# Patient Record
Sex: Female | Born: 1980 | Race: Black or African American | Hispanic: No | Marital: Single | Smoking: Never smoker
Health system: Southern US, Community
[De-identification: ages and names within clinical notes are randomized; demographics above are authoritative.]

## PROBLEM LIST (undated history)

## (undated) DIAGNOSIS — R071 Chest pain on breathing: Secondary | ICD-10-CM

## (undated) DIAGNOSIS — O2390 Unspecified genitourinary tract infection in pregnancy, unspecified trimester: Secondary | ICD-10-CM

## (undated) DIAGNOSIS — Z789 Other specified health status: Secondary | ICD-10-CM

## (undated) DIAGNOSIS — Z87898 Personal history of other specified conditions: Secondary | ICD-10-CM

## (undated) DIAGNOSIS — R55 Syncope and collapse: Secondary | ICD-10-CM

## (undated) DIAGNOSIS — Z332 Encounter for elective termination of pregnancy: Secondary | ICD-10-CM

## (undated) DIAGNOSIS — G5600 Carpal tunnel syndrome, unspecified upper limb: Secondary | ICD-10-CM

## (undated) HISTORY — DX: Syncope and collapse: R55

## (undated) HISTORY — PX: INDUCED ABORTION: SHX677

## (undated) HISTORY — DX: Personal history of other specified conditions: Z87.898

## (undated) HISTORY — DX: Unspecified genitourinary tract infection in pregnancy, unspecified trimester: O23.90

## (undated) HISTORY — DX: Chest pain on breathing: R07.1

## (undated) HISTORY — DX: Carpal tunnel syndrome, unspecified upper limb: G56.00

---

## 2006-06-16 ENCOUNTER — Emergency Department (HOSPITAL_COMMUNITY): Admission: EM | Admit: 2006-06-16 | Discharge: 2006-06-16 | Payer: Self-pay | Admitting: Emergency Medicine

## 2007-11-17 ENCOUNTER — Emergency Department (HOSPITAL_COMMUNITY): Admission: EM | Admit: 2007-11-17 | Discharge: 2007-11-17 | Payer: Self-pay | Admitting: Emergency Medicine

## 2009-02-08 ENCOUNTER — Emergency Department (HOSPITAL_COMMUNITY): Admission: EM | Admit: 2009-02-08 | Discharge: 2009-02-08 | Payer: Self-pay | Admitting: Emergency Medicine

## 2009-05-26 ENCOUNTER — Emergency Department (HOSPITAL_COMMUNITY): Admission: EM | Admit: 2009-05-26 | Discharge: 2009-05-26 | Payer: Self-pay | Admitting: Emergency Medicine

## 2010-10-06 LAB — URINE MICROSCOPIC-ADD ON

## 2010-10-06 LAB — URINALYSIS, ROUTINE W REFLEX MICROSCOPIC
Ketones, ur: NEGATIVE mg/dL
Protein, ur: NEGATIVE mg/dL
Urobilinogen, UA: 1 mg/dL (ref 0.0–1.0)

## 2010-10-06 LAB — BASIC METABOLIC PANEL
BUN: 8 mg/dL (ref 6–23)
Chloride: 109 mEq/L (ref 96–112)
GFR calc non Af Amer: >60 mL/min (ref >60)
Glucose, Bld: 109 mg/dL — ABNORMAL HIGH (ref 70–99)
Potassium: 3.3 mEq/L — ABNORMAL LOW (ref 3.5–5.1)
Sodium: 141 mEq/L (ref 135–145)

## 2010-10-06 LAB — POCT PREGNANCY, URINE: Preg Test, Ur: NEGATIVE

## 2011-03-27 LAB — POCT I-STAT, CHEM 8
Chloride: 105
Glucose, Bld: 94
HCT: 43
Hemoglobin: 14.6
Potassium: 3.8
Sodium: 142

## 2011-05-11 ENCOUNTER — Emergency Department (HOSPITAL_BASED_OUTPATIENT_CLINIC_OR_DEPARTMENT_OTHER)
Admission: EM | Admit: 2011-05-11 | Discharge: 2011-05-11 | Disposition: A | Discharge status: Home / Self Care | Payer: 59 | Attending: Emergency Medicine | Admitting: Emergency Medicine

## 2011-05-11 ENCOUNTER — Encounter: Payer: Self-pay | Admitting: *Deleted

## 2011-05-11 DIAGNOSIS — N760 Acute vaginitis: Secondary | ICD-10-CM | POA: Insufficient documentation

## 2011-05-11 DIAGNOSIS — R109 Unspecified abdominal pain: Secondary | ICD-10-CM | POA: Insufficient documentation

## 2011-05-11 DIAGNOSIS — F17200 Nicotine dependence, unspecified, uncomplicated: Secondary | ICD-10-CM | POA: Insufficient documentation

## 2011-05-11 DIAGNOSIS — B9689 Other specified bacterial agents as the cause of diseases classified elsewhere: Secondary | ICD-10-CM | POA: Insufficient documentation

## 2011-05-11 DIAGNOSIS — N72 Inflammatory disease of cervix uteri: Secondary | ICD-10-CM | POA: Insufficient documentation

## 2011-05-11 DIAGNOSIS — A499 Bacterial infection, unspecified: Secondary | ICD-10-CM | POA: Insufficient documentation

## 2011-05-11 LAB — URINALYSIS, ROUTINE W REFLEX MICROSCOPIC
Bilirubin Urine: NEGATIVE
Leukocytes, UA: NEGATIVE
Nitrite: NEGATIVE
Specific Gravity, Urine: 1.026 (ref 1.005–1.030)
Urobilinogen, UA: 1 mg/dL (ref 0.0–1.0)
pH: 6 (ref 5.0–8.0)

## 2011-05-11 LAB — PREGNANCY, URINE: Preg Test, Ur: NEGATIVE

## 2011-05-11 LAB — WET PREP, GENITAL
Trich, Wet Prep: NONE SEEN
Yeast Wet Prep HPF POC: NONE SEEN

## 2011-05-11 MED ORDER — IBUPROFEN 800 MG PO TABS
800.0000 mg | ORAL_TABLET | Freq: Once | ORAL | Status: AC
  Administered 2011-05-11: 800 mg via ORAL
  Filled 2011-05-11: qty 1

## 2011-05-11 MED ORDER — METRONIDAZOLE 500 MG PO TABS: 500.0000 mg | ORAL_TABLET | Freq: Two times a day (BID) | ORAL | Status: AC

## 2011-05-11 MED ORDER — LIDOCAINE HCL (PF) 1 % IJ SOLN
INTRAMUSCULAR | Status: AC
  Filled 2011-05-11: qty 5

## 2011-05-11 MED ORDER — CEFTRIAXONE SODIUM 250 MG IJ SOLR
250.0000 mg | Freq: Once | INTRAMUSCULAR | Status: AC
  Administered 2011-05-11: 250 mg via INTRAMUSCULAR
  Filled 2011-05-11: qty 250

## 2011-05-11 MED ORDER — AZITHROMYCIN 1 G PO PACK
1.0000 g | PACK | Freq: Once | ORAL | Status: AC
  Administered 2011-05-11: 1 g via ORAL
  Filled 2011-05-11: qty 1

## 2011-05-11 NOTE — ED Notes (Signed)
Care plan and follow up reviewed Pt knowlegable pleasent

## 2011-05-11 NOTE — ED Notes (Signed)
Pt states she had her mirena implanted in 07 and this is the year it is supposed to come out. Began having abd pain about 3 weeks ago with different amts of vag bleeding. Also c/o anxiety with shob and headache. Pain is worse when sitting, better standing. Thick white discharge.

## 2011-05-11 NOTE — ED Provider Notes (Signed)
History     CSN: [id number] Arrival date & time: 05/11/2011  1:17 PM   First MD Initiated Contact with Patient 05/11/11 1341      Chief Complaint  Patient presents with  . Abdominal Pain    (Consider location/radiation/quality/duration/timing/severity/associated sxs/prior treatment) HPI Patient presents with lower abdominal pain which has been present for 3 weeks. She states that the pain is similar to cramping in his located in the middle of her lower abdomen. She's had no fevers or vomiting or diarrhea. She had some vaginal bleeding 2 days ago and today spotting only. She has not passed any blood clots. She states she had similar pain when her IUD was implanted in 2007. She states she has contacted her gynecologist and has a followup appointment scheduled but came in today due to the pain. She also complains of white vaginal discharge. She has been taking ibuprofen which helps somewhat with the pain she has not had any today. She denies any other associated symptoms. Pain is described as moderate in severity.  History reviewed. No pertinent past medical history.  History reviewed. No pertinent past surgical history.  History reviewed. No pertinent family history.  History  Substance Use Topics  . Smoking status: Current Some Day Smoker  . Smokeless tobacco: Not on file  . Alcohol Use: Yes    OB History    Grav Para Term Preterm Abortions TAB SAB Ect Mult Living                  Review of Systems ROS reviewed and otherwise negative except for mentioned in HPI Allergies  Review of patient's allergies indicates no known allergies.  Home Medications   Current Outpatient Rx  Name Route Sig Dispense Refill  . METRONIDAZOLE 500 MG PO TABS Oral Take 1 tablet (500 mg total) by mouth 2 (two) times daily. 14 tablet 0    BP 152/89  Pulse 74  Temp(Src) 98.2 F (36.8 C) (Oral)  Resp 18  Ht 5' 4.5" (1.638 m)  Wt 165 lb (74.844 kg)  BMI 27.88 kg/m2  SpO2 99% Vitals  reviewed Physical Exam Physical Examination: General appearance - alert, well appearing, and in no distress Mental status - alert, oriented to person, place, and time Mouth - mucous membranes moist, pharynx normal without lesions Chest - clear to auscultation, no wheezes, rales or rhonchi, symmetric air entry Heart - normal rate, regular rhythm, normal S1, S2, no murmurs, rubs, clicks or gallops Abdomen - soft, nondistended, no masses or organomegaly, mild lower abdominal tenderness to palpation, no gaurding or rebound tenderness Pelvic - normal external genitalia, vulva, vagina, cervix, uterus and adnexa, IUD strings in place, no CMT, no adnexal tenderness, no cervical erythema Musculoskeletal - no joint tenderness, deformity or swelling Extremities - peripheral pulses normal, no pedal edema, no clubbing or cyanosis Skin - normal coloration and turgor, no rashes, no suspicious skin lesions noted  ED Course  Procedures (including critical care time)  Labs Reviewed  WET PREP, GENITAL - Abnormal; Notable for the following:    Clue Cells, Wet Prep TOO NUMEROUS TO COUNT (*)    WBC, Wet Prep HPF POC TOO NUMEROUS TO COUNT (*)    All other components within normal limits  URINALYSIS, ROUTINE W REFLEX MICROSCOPIC  PREGNANCY, URINE  GC/CHLAMYDIA PROBE AMP, GENITAL   No results found.   1. Cervicitis   2. Bacterial vaginosis       MDM  Pt with lower abdominal pain, has IUD in place, pelvic  exam shows IUD strings in place- pt without any uterine or adnexal tenderness or CMT on exam- so doubt complication of IUD or PID.  Wet prep shows clue cells and many WBCs so treated for cervicitis and BV.  Offered to schedule pelvic ultrasound for tomorrow here, but patient declined- states she has scheduled an appointment Monday 05/13/11 with GYN and would prefer to follow up with them.  Discharged with strict return precautions.  Pt agreeable with plan.         Ethelda Chick, MD 05/11/11  [phone]

## 2011-06-17 ENCOUNTER — Ambulatory Visit: Payer: 59 | Admitting: Internal Medicine

## 2011-06-20 ENCOUNTER — Encounter: Payer: Self-pay | Admitting: Internal Medicine

## 2011-06-20 ENCOUNTER — Ambulatory Visit (INDEPENDENT_AMBULATORY_CARE_PROVIDER_SITE_OTHER): Payer: 59 | Admitting: Internal Medicine

## 2011-06-20 ENCOUNTER — Other Ambulatory Visit (INDEPENDENT_AMBULATORY_CARE_PROVIDER_SITE_OTHER): Payer: 59

## 2011-06-20 VITALS — BP 110/78 | HR 78 | Temp 100.1°F | Ht 64.0 in | Wt 165.0 lb

## 2011-06-20 DIAGNOSIS — F329 Major depressive disorder, single episode, unspecified: Secondary | ICD-10-CM | POA: Insufficient documentation

## 2011-06-20 DIAGNOSIS — F32A Depression, unspecified: Secondary | ICD-10-CM

## 2011-06-20 DIAGNOSIS — D649 Anemia, unspecified: Secondary | ICD-10-CM

## 2011-06-20 DIAGNOSIS — F419 Anxiety disorder, unspecified: Secondary | ICD-10-CM

## 2011-06-20 DIAGNOSIS — F341 Dysthymic disorder: Secondary | ICD-10-CM

## 2011-06-20 DIAGNOSIS — O9081 Anemia of the puerperium: Secondary | ICD-10-CM | POA: Insufficient documentation

## 2011-06-20 HISTORY — DX: Depression, unspecified: F32.A

## 2011-06-20 LAB — COMPREHENSIVE METABOLIC PANEL
AST: 17 U/L (ref 0–37)
BUN: 11 mg/dL (ref 6–23)
Calcium: 8.9 mg/dL (ref 8.4–10.5)
Chloride: 106 mEq/L (ref 96–112)
Creatinine, Ser: 0.7 mg/dL (ref 0.4–1.2)
Total Bilirubin: 0.5 mg/dL (ref 0.3–1.2)

## 2011-06-20 LAB — CBC WITH DIFFERENTIAL/PLATELET
Basophils Absolute: 0 10*3/uL (ref 0.0–0.1)
Basophils Relative: 0.2 % (ref 0.0–3.0)
Eosinophils Absolute: 0.1 10*3/uL (ref 0.0–0.7)
HCT: 39.1 % (ref 36.0–46.0)
Hemoglobin: 12.9 g/dL (ref 12.0–15.0)
Lymphocytes Relative: 33.9 % (ref 12.0–46.0)
Lymphs Abs: 2.7 10*3/uL (ref 0.7–4.0)
MCHC: 33.1 g/dL (ref 30.0–36.0)
MCV: 84.8 fl (ref 78.0–100.0)
Monocytes Absolute: 0.5 10*3/uL (ref 0.1–1.0)
Neutro Abs: 4.7 10*3/uL (ref 1.4–7.7)
RBC: 4.61 Mil/uL (ref 3.87–5.11)
RDW: 14 % (ref 11.5–14.6)

## 2011-06-20 LAB — VITAMIN B12: Vitamin B-12: 594 pg/mL (ref 211–911)

## 2011-06-20 LAB — IBC PANEL
Iron: 27 ug/dL — ABNORMAL LOW (ref 42–145)
Saturation Ratios: 8.2 % — ABNORMAL LOW (ref 20.0–50.0)
Transferrin: 234.5 mg/dL (ref 212.0–360.0)

## 2011-06-20 MED ORDER — SERTRALINE HCL 50 MG PO TABS: 50.0000 mg | ORAL_TABLET | Freq: Every day | ORAL | Status: DC

## 2011-06-20 NOTE — Patient Instructions (Signed)
Anemia, Nonspecific Your exam and blood tests show you are anemic. This means your blood (hemoglobin) level is low. Normal hemoglobin values are 12 to 15 g/dL for females and 14 to 17 g/dL for males. Make a note of your hemoglobin level today. The hematocrit percent is also used to measure anemia. A normal hematocrit is 38% to 46% in females and 42% to 49% in males. Make a note of your hematocrit level today. CAUSES  Anemia can be due to many different causes.  Excessive bleeding from periods (in women).   Intestinal bleeding.   Poor nutrition.   Kidney, thyroid, liver, and bone marrow diseases.  SYMPTOMS  Anemia can come on suddenly (acute). It can also come on slowly. Symptoms can include:  Minor weakness.   Dizziness.   Palpitations.   Shortness of breath.  Symptoms may be absent until half your hemoglobin is missing if it comes on slowly. Anemia due to acute blood loss from an injury or internal bleeding may require blood transfusion if the loss is severe. Hospital care is needed if you are anemic and there is significant continual blood loss. TREATMENT   Stool tests for blood (Hemoccult) and additional lab tests are often needed. This determines the best treatment.   Further checking on your condition and your response to treatment is very important. It often takes many weeks to correct anemia.  Depending on the cause, treatment can include:  Supplements of iron.   Vitamins B12 and folic acid.   Hormone medicines.If your anemia is due to bleeding, finding the cause of the blood loss is very important. This will help avoid further problems.  SEEK IMMEDIATE MEDICAL CARE IF:   You develop fainting, extreme weakness, shortness of breath, or chest pain.   You develop heavy vaginal bleeding.   You develop bloody or black, tarry stools or vomit up blood.   You develop a high fever, rash, repeated vomiting, or dehydration.  Document Released: 07/25/2004 Document Revised:  02/27/2011 Document Reviewed: 05/02/2009 Exit[employer] Patient Information 2012 [employer]. 

## 2011-06-20 NOTE — Assessment & Plan Note (Signed)
I will check her CBC and will look at her vitamin levels 

## 2011-06-20 NOTE — Progress Notes (Signed)
  Subjective:    Patient ID: Virginia Guerrero, female    DOB: 20-Feb-1981, 30 y.o.   MRN: [id number]  Anemia Presents for follow-up visit. Symptoms include light-headedness. There has been no abdominal pain, anorexia, bruising/bleeding easily, confusion, fever, leg swelling, malaise/fatigue, pallor, palpitations, paresthesias, pica or weight loss. Signs of blood loss that are present include menorrhagia and vaginal bleeding. Signs of blood loss that are not present include hematemesis, hematochezia and melena. There are no compliance problems.  Compliance with medications is 51-75%.      Review of Systems  Constitutional: Negative for fever, chills, weight loss, malaise/fatigue, diaphoresis, activity change, appetite change, fatigue and unexpected weight change.  HENT: Negative.   Respiratory: Negative for cough, choking, chest tightness, shortness of breath and stridor.   Cardiovascular: Negative for chest pain, palpitations and leg swelling.  Gastrointestinal: Negative for abdominal pain, melena, hematochezia, anorexia and hematemesis.  Genitourinary: Positive for vaginal bleeding, menstrual problem (PMS) and menorrhagia. Negative for dysuria, urgency, frequency, hematuria, flank pain, decreased urine volume, vaginal discharge, enuresis, difficulty urinating, vaginal pain, pelvic pain and dyspareunia.  Musculoskeletal: Negative.   Skin: Negative for color change, pallor, rash and wound.  Neurological: Positive for dizziness and light-headedness. Negative for tremors, seizures, syncope, facial asymmetry, speech difficulty, weakness, numbness, headaches and paresthesias.  Hematological: Negative for adenopathy. Does not bruise/bleed easily.  Psychiatric/Behavioral: Positive for sleep disturbance and dysphoric mood. Negative for suicidal ideas, hallucinations, behavioral problems, confusion, self-injury, decreased concentration and agitation. The patient is nervous/anxious. The patient is not  hyperactive.        Objective:   Physical Exam  Vitals reviewed. Constitutional: She is oriented to person, place, and time. She appears well-developed and well-nourished. No distress.  HENT:  Head: Normocephalic and atraumatic.  Mouth/Throat: Oropharynx is clear and moist. No oropharyngeal exudate.  Eyes: Conjunctivae are normal. Right eye exhibits no discharge. Left eye exhibits no discharge. No scleral icterus.  Neck: Normal range of motion. Neck supple. No JVD present. No tracheal deviation present. No thyromegaly present.  Cardiovascular: Normal rate, regular rhythm, normal heart sounds and intact distal pulses.  Exam reveals no gallop and no friction rub.   No murmur heard. Pulmonary/Chest: Effort normal and breath sounds normal. No stridor. No respiratory distress. She has no wheezes. She has no rales. She exhibits no tenderness.  Abdominal: Soft. Bowel sounds are normal. She exhibits no distension and no mass. There is no tenderness. There is no rebound.  Musculoskeletal: Normal range of motion. She exhibits no edema and no tenderness.  Lymphadenopathy:    She has no cervical adenopathy.  Neurological: She is oriented to person, place, and time.  Skin: Skin is warm and dry. No rash noted. She is not diaphoretic. No erythema. No pallor.  Psychiatric: She has a normal mood and affect. Her behavior is normal. Judgment and thought content normal.     Lab Results  Component Value Date   HGB 14.6 11/17/2007   HCT 43.0 11/17/2007   GLUCOSE 109* 02/08/2009   NA 141 02/08/2009   K 3.3* 02/08/2009   CL 109 02/08/2009   CREATININE 0.64 02/08/2009   BUN 8 02/08/2009   CO2 27 02/08/2009       Assessment & Plan:

## 2011-06-20 NOTE — Assessment & Plan Note (Signed)
Start zoloft

## 2012-04-08 ENCOUNTER — Encounter (HOSPITAL_COMMUNITY): Payer: Self-pay | Admitting: Student-PharmD

## 2012-04-09 ENCOUNTER — Encounter (HOSPITAL_COMMUNITY): Payer: Self-pay | Admitting: *Deleted

## 2012-04-09 ENCOUNTER — Other Ambulatory Visit: Payer: Self-pay | Admitting: Obstetrics and Gynecology

## 2012-04-13 ENCOUNTER — Encounter (HOSPITAL_COMMUNITY): Admission: RE | Payer: Self-pay | Source: Ambulatory Visit

## 2012-04-13 ENCOUNTER — Ambulatory Visit (HOSPITAL_COMMUNITY): Admission: RE | Admit: 2012-04-13 | Payer: 59 | Source: Ambulatory Visit | Admitting: Obstetrics and Gynecology

## 2012-04-13 HISTORY — DX: Other specified health status: Z78.9

## 2012-04-13 HISTORY — DX: Encounter for elective termination of pregnancy: Z33.2

## 2012-04-13 SURGERY — DILATION AND EVACUATION, UTERUS
Anesthesia: Choice

## 2012-04-21 LAB — OB RESULTS CONSOLE ABO/RH: RH Type: POSITIVE

## 2012-04-21 LAB — OB RESULTS CONSOLE ANTIBODY SCREEN: Antibody Screen: NEGATIVE

## 2012-04-21 LAB — OB RESULTS CONSOLE HIV ANTIBODY (ROUTINE TESTING): HIV: NONREACTIVE

## 2012-04-28 LAB — OB RESULTS CONSOLE GC/CHLAMYDIA: Gonorrhea: NEGATIVE

## 2012-11-09 ENCOUNTER — Encounter (HOSPITAL_COMMUNITY): Payer: Self-pay | Admitting: *Deleted

## 2012-11-09 ENCOUNTER — Telehealth (HOSPITAL_COMMUNITY): Payer: Self-pay | Admitting: *Deleted

## 2012-11-09 NOTE — Telephone Encounter (Signed)
Preadmission screen  

## 2012-11-13 ENCOUNTER — Other Ambulatory Visit: Payer: Self-pay | Admitting: Obstetrics and Gynecology

## 2012-11-17 ENCOUNTER — Encounter (HOSPITAL_COMMUNITY): Payer: Self-pay

## 2012-11-17 ENCOUNTER — Inpatient Hospital Stay (HOSPITAL_COMMUNITY)
Admission: RE | Admit: 2012-11-17 | Discharge: 2012-11-20 | DRG: 766 | Disposition: A | Discharge status: Home / Self Care | Payer: 59 | Source: Ambulatory Visit | Attending: Obstetrics and Gynecology | Admitting: Obstetrics and Gynecology

## 2012-11-17 ENCOUNTER — Encounter (HOSPITAL_COMMUNITY): Payer: Self-pay | Admitting: Anesthesiology

## 2012-11-17 ENCOUNTER — Inpatient Hospital Stay (HOSPITAL_COMMUNITY): Payer: 59 | Admitting: Anesthesiology

## 2012-11-17 ENCOUNTER — Encounter (HOSPITAL_COMMUNITY)
Admission: RE | Disposition: A | Discharge status: Home / Self Care | Payer: Self-pay | Source: Ambulatory Visit | Attending: Obstetrics and Gynecology

## 2012-11-17 DIAGNOSIS — Z22330 Carrier of Group B streptococcus: Secondary | ICD-10-CM

## 2012-11-17 DIAGNOSIS — O99892 Other specified diseases and conditions complicating childbirth: Secondary | ICD-10-CM | POA: Diagnosis present

## 2012-11-17 DIAGNOSIS — O324XX0 Maternal care for high head at term, not applicable or unspecified: Secondary | ICD-10-CM | POA: Diagnosis present

## 2012-11-17 DIAGNOSIS — K649 Unspecified hemorrhoids: Secondary | ICD-10-CM | POA: Diagnosis present

## 2012-11-17 DIAGNOSIS — O878 Other venous complications in the puerperium: Principal | ICD-10-CM | POA: Diagnosis present

## 2012-11-17 LAB — TYPE AND SCREEN
ABO/RH(D): AB POS
Antibody Screen: NEGATIVE

## 2012-11-17 LAB — CBC
Hemoglobin: 12.1 g/dL (ref 12.0–15.0)
Platelets: 199 10*3/uL (ref 150–400)
RBC: 4.48 MIL/uL (ref 3.87–5.11)

## 2012-11-17 LAB — RPR: RPR Ser Ql: NONREACTIVE

## 2012-11-17 SURGERY — Surgical Case
Anesthesia: Epidural | Site: Abdomen | Wound class: Clean Contaminated

## 2012-11-17 MED ORDER — PENICILLIN G POTASSIUM 5000000 UNITS IJ SOLR
2.5000 10*6.[IU] | INTRAVENOUS | Status: DC
  Administered 2012-11-17 (×3): 2.5 10*6.[IU] via INTRAVENOUS
  Filled 2012-11-17 (×9): qty 2.5

## 2012-11-17 MED ORDER — ONDANSETRON HCL 4 MG/2ML IJ SOLN
INTRAMUSCULAR | Status: AC
  Filled 2012-11-17: qty 2

## 2012-11-17 MED ORDER — LACTATED RINGERS IV SOLN: 500.0000 mL | Freq: Once | INTRAVENOUS | Status: DC

## 2012-11-17 MED ORDER — LIDOCAINE-EPINEPHRINE (PF) 2 %-1:200000 IJ SOLN
INTRAMUSCULAR | Status: AC
  Filled 2012-11-17: qty 20

## 2012-11-17 MED ORDER — BUPIVACAINE HCL (PF) 0.25 % IJ SOLN
INTRAMUSCULAR | Status: DC | PRN
  Administered 2012-11-17: 10 mL

## 2012-11-17 MED ORDER — LIDOCAINE HCL (PF) 1 % IJ SOLN
30.0000 mL | INTRAMUSCULAR | Status: DC | PRN
  Filled 2012-11-17: qty 30

## 2012-11-17 MED ORDER — ONDANSETRON HCL 4 MG/2ML IJ SOLN
INTRAMUSCULAR | Status: DC | PRN
  Administered 2012-11-17: 4 mg via INTRAVENOUS

## 2012-11-17 MED ORDER — OXYTOCIN 40 UNITS IN LACTATED RINGERS INFUSION - SIMPLE MED
1.0000 m[IU]/min | INTRAVENOUS | Status: DC
  Administered 2012-11-17: 2 m[IU]/min via INTRAVENOUS
  Filled 2012-11-17: qty 1000

## 2012-11-17 MED ORDER — LACTATED RINGERS IV SOLN: 500.0000 mL | INTRAVENOUS | Status: DC | PRN

## 2012-11-17 MED ORDER — BUPIVACAINE HCL (PF) 0.25 % IJ SOLN
INTRAMUSCULAR | Status: AC
  Filled 2012-11-17: qty 30

## 2012-11-17 MED ORDER — DIPHENHYDRAMINE HCL 50 MG/ML IJ SOLN
12.5000 mg | INTRAMUSCULAR | Status: DC | PRN
  Administered 2012-11-18: 12.5 mg via INTRAVENOUS

## 2012-11-17 MED ORDER — EPHEDRINE 5 MG/ML INJ
10.0000 mg | INTRAVENOUS | Status: DC | PRN
  Filled 2012-11-17: qty 4

## 2012-11-17 MED ORDER — MEPERIDINE HCL 25 MG/ML IJ SOLN: 6.2500 mg | INTRAMUSCULAR | Status: DC | PRN

## 2012-11-17 MED ORDER — ONDANSETRON HCL 4 MG/2ML IJ SOLN: 4.0000 mg | Freq: Four times a day (QID) | INTRAMUSCULAR | Status: DC | PRN

## 2012-11-17 MED ORDER — KETOROLAC TROMETHAMINE 30 MG/ML IJ SOLN: 30.0000 mg | Freq: Four times a day (QID) | INTRAMUSCULAR | Status: AC | PRN

## 2012-11-17 MED ORDER — KETOROLAC TROMETHAMINE 30 MG/ML IJ SOLN
30.0000 mg | Freq: Four times a day (QID) | INTRAMUSCULAR | Status: AC | PRN
  Administered 2012-11-17: 30 mg via INTRAVENOUS

## 2012-11-17 MED ORDER — OXYTOCIN BOLUS FROM INFUSION: 500.0000 mL | INTRAVENOUS | Status: DC

## 2012-11-17 MED ORDER — TERBUTALINE SULFATE 1 MG/ML IJ SOLN: 0.2500 mg | Freq: Once | INTRAMUSCULAR | Status: AC | PRN

## 2012-11-17 MED ORDER — FLEET ENEMA 7-19 GM/118ML RE ENEM: 1.0000 | ENEMA | RECTAL | Status: DC | PRN

## 2012-11-17 MED ORDER — FENTANYL 2.5 MCG/ML BUPIVACAINE 1/10 % EPIDURAL INFUSION (WH - ANES)
14.0000 mL/h | INTRAMUSCULAR | Status: DC | PRN
  Administered 2012-11-17 (×2): 14 mL/h via EPIDURAL
  Filled 2012-11-17 (×2): qty 125

## 2012-11-17 MED ORDER — MORPHINE SULFATE (PF) 0.5 MG/ML IJ SOLN
INTRAMUSCULAR | Status: DC | PRN
  Administered 2012-11-17: 4 mg via EPIDURAL

## 2012-11-17 MED ORDER — MEPERIDINE HCL 25 MG/ML IJ SOLN
INTRAMUSCULAR | Status: DC | PRN
  Administered 2012-11-17: 25 mg via INTRAVENOUS

## 2012-11-17 MED ORDER — BUTORPHANOL TARTRATE 1 MG/ML IJ SOLN: 1.0000 mg | INTRAMUSCULAR | Status: DC | PRN

## 2012-11-17 MED ORDER — PENICILLIN G POTASSIUM 5000000 UNITS IJ SOLR
5.0000 10*6.[IU] | Freq: Once | INTRAVENOUS | Status: AC
  Administered 2012-11-17: 5 10*6.[IU] via INTRAVENOUS
  Filled 2012-11-17: qty 5

## 2012-11-17 MED ORDER — ACETAMINOPHEN 325 MG PO TABS: 650.0000 mg | ORAL_TABLET | ORAL | Status: DC | PRN

## 2012-11-17 MED ORDER — MEPERIDINE HCL 25 MG/ML IJ SOLN
INTRAMUSCULAR | Status: AC
  Filled 2012-11-17: qty 1

## 2012-11-17 MED ORDER — MORPHINE SULFATE 0.5 MG/ML IJ SOLN
INTRAMUSCULAR | Status: AC
  Filled 2012-11-17: qty 10

## 2012-11-17 MED ORDER — LACTATED RINGERS IV SOLN
INTRAVENOUS | Status: DC | PRN
  Administered 2012-11-17 (×2): via INTRAVENOUS

## 2012-11-17 MED ORDER — METOCLOPRAMIDE HCL 5 MG/ML IJ SOLN
10.0000 mg | Freq: Three times a day (TID) | INTRAMUSCULAR | Status: DC | PRN
  Administered 2012-11-18: 10 mg via INTRAVENOUS

## 2012-11-17 MED ORDER — CITRIC ACID-SODIUM CITRATE 334-500 MG/5ML PO SOLN
30.0000 mL | ORAL | Status: DC | PRN
  Administered 2012-11-17: 30 mL via ORAL
  Filled 2012-11-17: qty 15

## 2012-11-17 MED ORDER — OXYCODONE-ACETAMINOPHEN 5-325 MG PO TABS: 1.0000 | ORAL_TABLET | ORAL | Status: DC | PRN

## 2012-11-17 MED ORDER — SODIUM BICARBONATE 8.4 % IV SOLN
INTRAVENOUS | Status: AC
  Filled 2012-11-17: qty 50

## 2012-11-17 MED ORDER — PHENYLEPHRINE 40 MCG/ML (10ML) SYRINGE FOR IV PUSH (FOR BLOOD PRESSURE SUPPORT)
80.0000 ug | PREFILLED_SYRINGE | INTRAVENOUS | Status: DC | PRN
  Filled 2012-11-17: qty 5

## 2012-11-17 MED ORDER — SCOPOLAMINE 1 MG/3DAYS TD PT72
MEDICATED_PATCH | TRANSDERMAL | Status: AC
  Filled 2012-11-17: qty 1

## 2012-11-17 MED ORDER — OXYTOCIN 10 UNIT/ML IJ SOLN
INTRAMUSCULAR | Status: AC
  Filled 2012-11-17: qty 4

## 2012-11-17 MED ORDER — IBUPROFEN 600 MG PO TABS: 600.0000 mg | ORAL_TABLET | Freq: Four times a day (QID) | ORAL | Status: DC | PRN

## 2012-11-17 MED ORDER — MIDAZOLAM HCL 2 MG/2ML IJ SOLN: 0.5000 mg | Freq: Once | INTRAMUSCULAR | Status: AC | PRN

## 2012-11-17 MED ORDER — SODIUM BICARBONATE 8.4 % IV SOLN
INTRAVENOUS | Status: DC | PRN
  Administered 2012-11-17: 5 mL via EPIDURAL

## 2012-11-17 MED ORDER — LACTATED RINGERS IV SOLN
INTRAVENOUS | Status: DC
  Administered 2012-11-17 (×3): via INTRAVENOUS
  Administered 2012-11-17: 300 mL via INTRAVENOUS
  Administered 2012-11-17: 500 mL via INTRAVENOUS

## 2012-11-17 MED ORDER — OXYTOCIN 10 UNIT/ML IJ SOLN
INTRAMUSCULAR | Status: DC | PRN
  Administered 2012-11-17: 40 [IU]

## 2012-11-17 MED ORDER — KETOROLAC TROMETHAMINE 30 MG/ML IJ SOLN
INTRAMUSCULAR | Status: AC
  Filled 2012-11-17: qty 1

## 2012-11-17 MED ORDER — OXYTOCIN 40 UNITS IN LACTATED RINGERS INFUSION - SIMPLE MED: 62.5000 mL/h | INTRAVENOUS | Status: DC

## 2012-11-17 MED ORDER — SCOPOLAMINE 1 MG/3DAYS TD PT72
1.0000 | MEDICATED_PATCH | Freq: Once | TRANSDERMAL | Status: DC
  Administered 2012-11-17: 1.5 mg via TRANSDERMAL

## 2012-11-17 MED ORDER — DEXTROSE 5 % IV SOLN
2.0000 g | INTRAVENOUS | Status: AC
  Administered 2012-11-17: 2 g via INTRAVENOUS
  Filled 2012-11-17: qty 2

## 2012-11-17 MED ORDER — PROMETHAZINE HCL 25 MG/ML IJ SOLN: 6.2500 mg | INTRAMUSCULAR | Status: DC | PRN

## 2012-11-17 MED ORDER — FENTANYL CITRATE 0.05 MG/ML IJ SOLN: 25.0000 ug | INTRAMUSCULAR | Status: DC | PRN

## 2012-11-17 SURGICAL SUPPLY — 31 items
CLOTH BEACON ORANGE TIMEOUT ST (SAFETY) ×2 IMPLANT
CONTAINER PREFILL 10% NBF 15ML (MISCELLANEOUS) IMPLANT
DRAPE LG THREE QUARTER DISP (DRAPES) ×2 IMPLANT
DRSG OPSITE POSTOP 4X10 (GAUZE/BANDAGES/DRESSINGS) ×2 IMPLANT
DRSG OPSITE POSTOP 4X12 (GAUZE/BANDAGES/DRESSINGS) ×2 IMPLANT
DURAPREP 26ML APPLICATOR (WOUND CARE) ×2 IMPLANT
ELECT REM PT RETURN 9FT ADLT (ELECTROSURGICAL) ×2
ELECTRODE REM PT RTRN 9FT ADLT (ELECTROSURGICAL) ×1 IMPLANT
EXTRACTOR VACUUM M CUP 4 TUBE (SUCTIONS) IMPLANT
GLOVE BIO SURGEON STRL SZ7.5 (GLOVE) ×2 IMPLANT
GLOVE SS N UNI LF 7.0 STRL (GLOVE) ×2 IMPLANT
GLOVE SURG SS PI 7.0 STRL IVOR (GLOVE) ×2 IMPLANT
GOWN PREVENTION PLUS XLARGE (GOWN DISPOSABLE) ×2 IMPLANT
GOWN STRL REIN XL XLG (GOWN DISPOSABLE) ×4 IMPLANT
KIT ABG SYR 3ML LUER SLIP (SYRINGE) ×2 IMPLANT
NEEDLE HYPO 25X1 1.5 SAFETY (NEEDLE) ×2 IMPLANT
NEEDLE HYPO 25X5/8 SAFETYGLIDE (NEEDLE) ×2 IMPLANT
NS IRRIG 1000ML POUR BTL (IV SOLUTION) ×2 IMPLANT
PACK C SECTION WH (CUSTOM PROCEDURE TRAY) ×2 IMPLANT
STAPLER VISISTAT 35W (STAPLE) ×2 IMPLANT
SUT MNCRL 0 VIOLET CTX 36 (SUTURE) ×2 IMPLANT
SUT MON AB 2-0 CT1 27 (SUTURE) ×2 IMPLANT
SUT MON AB-0 CT1 36 (SUTURE) ×4 IMPLANT
SUT MONOCRYL 0 CTX 36 (SUTURE) ×2
SUT PLAIN 0 NONE (SUTURE) IMPLANT
SUT PLAIN 2 0 XLH (SUTURE) IMPLANT
SYR CONTROL 10ML LL (SYRINGE) ×2 IMPLANT
TOWEL OR 17X24 6PK STRL BLUE (TOWEL DISPOSABLE) ×6 IMPLANT
TRAY FOLEY CATH 14FR (SET/KITS/TRAYS/PACK) IMPLANT
WATER STERILE IRR 1000ML POUR (IV SOLUTION) ×2 IMPLANT
YANKAUER SUCT BULB TIP NO VENT (SUCTIONS) ×2 IMPLANT

## 2012-11-17 NOTE — Progress Notes (Signed)
Virginia Guerrero is a 32 y.o. G3P1011 at [redacted]w[redacted]d by LMP admitted for induction of labor due to pelvic pain, symptomatic hemorrhoids and favorable cervix.  Subjective: Feel contractions  Objective: BP 128/92  Pulse 93  Temp(Src) 98.3 F (36.8 C) (Oral)  Resp 20  Ht 5\' 4"  (1.626 m)  Wt 95.709 kg (211 lb)  BMI 36.2 kg/m2  LMP 02/18/2012      FHT:  FHR: 150 bpm, variability: moderate,  accelerations:  Present,  decelerations:  Absent and   UC:   regular, every 3 minutes SVE:   Dilation: 3 Effacement (%): 60 Station: -2 Exam by:: Dr. Billy Coast AROM-? Light meconium  Labs: Lab Results  Component Value Date   WBC 7.2 11/17/2012   HGB 12.1 11/17/2012   HCT 36.3 11/17/2012   MCV 81.0 11/17/2012   PLT 199 11/17/2012    Assessment / Plan: Induction of labor due to above indications,  progressing well on pitocin  Labor: Progressing normally Preeclampsia:  na Fetal Wellbeing:  Category I Pain Control:  Labor support without medications I/D:  n/a Anticipated MOD:  NSVD  [name] 11/17/2012, 9:32 AM

## 2012-11-17 NOTE — Progress Notes (Signed)
Dr Billy Coast updated on FHR, UC pattern, pt pushing efforts, and SVE.  Orders given to continue pushing.

## 2012-11-17 NOTE — Progress Notes (Signed)
Virginia Guerrero is a 32 y.o. G3P1011 at [redacted]w[redacted]d by LMP admitted for induction of labor due to pelvic pain, symptomaic hemorrhoids and favorable cervix.  Subjective: Feels pressure with contractions.  Objective: BP 115/74  Pulse 110  Temp(Src) 99 F (37.2 C) (Oral)  Resp 20  Ht 5\' 4"  (1.626 m)  Wt 95.709 kg (211 lb)  BMI 36.2 kg/m2  SpO2 99%  LMP 02/18/2012      FHT:  FHR: 155 bpm, variability: moderate,  accelerations:  Present,  decelerations:  Present occ early UC:   regular, every 2 minutes SVE:   Dilation: 7.5 Effacement (%): 70 Station: -1 Exam by:: Dr. Billy Coast ? Asynclitic, OT IUPC placed without difficulty  Labs: Lab Results  Component Value Date   WBC 7.2 11/17/2012   HGB 12.1 11/17/2012   HCT 36.3 11/17/2012   MCV 81.0 11/17/2012   PLT 199 11/17/2012    Assessment / Plan: Protracted active phase- ? asynclitic  Labor: Progressing normally, will reposition  Preeclampsia:  na Fetal Wellbeing:  Category I Pain Control:  Epidural I/D:  n/a Anticipated MOD:  NSVD  Harwood Nall J 11/17/2012, 5:49 PM

## 2012-11-17 NOTE — Op Note (Signed)
Cesarean Section Procedure Note  Indications: non-reassuring fetal status  Pre-operative Diagnosis: 39 week 0 day pregnancy.  Post-operative Diagnosis: same  Surgeon: Lenoard Aden   Assistants: Ernestina Penna, MD  Anesthesia: Epidural anesthesia and Local anesthesia 0.25.% bupivacaine  ASA Class: 2  Procedure Details  The patient was seen in the Holding Room. The risks, benefits, complications, treatment options, and expected outcomes were discussed with the patient.  The patient concurred with the proposed plan, giving informed consent. The risks of anesthesia, infection, bleeding and possible injury to other organs discussed. Injury to bowel, bladder, or ureter with possible need for repair discussed. Possible need for transfusion with secondary risks of hepatitis or HIV acquisition discussed. Post operative complications to include but not limited to DVT, PE and Pneumonia noted. The site of surgery properly noted/marked. The patient was taken to Operating Room # 2, identified as Jacqualine Mau and the procedure verified as C-Section Delivery. A Time Out was held and the above information confirmed.  After induction of anesthesia, the patient was draped and prepped in the usual sterile manner. A Pfannenstiel incision was made and carried down through the subcutaneous tissue to the fascia. Fascial incision was made and extended transversely using Mayo scissors. The fascia was separated from the underlying rectus tissue superiorly and inferiorly. The peritoneum was identified and entered. Peritoneal incision was extended longitudinally. The utero-vesical peritoneal reflection was incised transversely and the bladder flap was bluntly freed from the lower uterine segment. A low transverse uterine incision(Kerr hysterotomy) was made. Delivered from OP presentation was a  female with Apgar scores of 9 at one minute and 9 at five minutes. Bulb suctioning gently performed. Neonatal team in  attendance.After the umbilical cord was clamped and cut cord blood was obtained for evaluation. The placenta was removed intact and appeared normal. The uterus was curetted with a dry lap pack. Good hemostasis was noted.The uterine outline, tubes and ovaries appeared normal. The uterine incision was closed with running locked sutures of 0 Monocryl x 2 layers. Hemostasis was observed. Lavage was carried out until clear.The parietal peritoneum was closed with a running 2-0 Monocryl suture. The fascia was then reapproximated with running sutures of 0 Monocryl. The skin was reapproximated with staples.  Instrument, sponge, and needle counts were correct prior the abdominal closure and at the conclusion of the case.   Findings: Cord ph 7.31  Estimated Blood Loss:  500         Drains: foley                 Specimens: placenta                 Complications:  None; patient tolerated the procedure well.         Disposition: PACU - hemodynamically stable.         Condition: stable  Attending Attestation: I performed the procedure.

## 2012-11-17 NOTE — Progress Notes (Signed)
Virginia Guerrero is a 32 y.o. G3P1011 at [redacted]w[redacted]d by LMP admitted for induction of labor due to pelvic pain.  Subjective: comfortable  Objective: BP 127/87  Pulse 88  Temp(Src) 98.2 F (36.8 C) (Oral)  Resp 20  Ht 5\' 4"  (1.626 m)  Wt 95.709 kg (211 lb)  BMI 36.2 kg/m2  SpO2 100%  LMP 02/18/2012      FHT:  FHR: 140 bpm, variability: moderate,  accelerations:  Present,  decelerations:  Present occ early, occ variable UC:   regular, every 2 minutes SVE:   6-7/90/-1  Labs: Lab Results  Component Value Date   WBC 7.2 11/17/2012   HGB 12.1 11/17/2012   HCT 36.3 11/17/2012   MCV 81.0 11/17/2012   PLT 199 11/17/2012    Assessment / Plan: Induction of labor due to above indications,  progressing well on pitocin  Labor: Progressing normally Preeclampsia:  na Fetal Wellbeing:  Category I Pain Control:  Epidural I/D:  n/a Anticipated MOD:  NSVD  Deyon Chizek J 11/17/2012, 12:31 PM

## 2012-11-17 NOTE — Progress Notes (Signed)
Dr Billy Coast discussed risks and benefits of primary c section.  Pt verbalized understanding and consent was signed.

## 2012-11-17 NOTE — Anesthesia Procedure Notes (Signed)

## 2012-11-17 NOTE — Transfer of Care (Signed)
Immediate Anesthesia Transfer of Care Note  Patient: Virginia Guerrero  Procedure(s) Performed: Procedure(s): CESAREAN SECTION (N/A)  Patient Location: PACU  Anesthesia Type:Epidural  Level of Consciousness: awake, alert  and oriented  Airway & Oxygen Therapy: Patient Spontanous Breathing  Post-op Assessment: Report given to PACU RN and Post -op Vital signs reviewed and stable  Post vital signs: Reviewed and stable  Complications: No apparent anesthesia complications

## 2012-11-17 NOTE — Progress Notes (Signed)
Ressie Heath is a 32 y.o. G3P1011 at [redacted]w[redacted]d by LMP admitted for induction of labor due to pelvic pain and favorable cervix..  Subjective: Pushing well x 2.5hrs  Objective: BP 135/79  Pulse 101  Temp(Src) 99.5 F (37.5 C) (Oral)  Resp 20  Ht 5\' 4"  (1.626 m)  Wt 95.709 kg (211 lb)  BMI 36.2 kg/m2  SpO2 99%  LMP 02/18/2012      FHT:  FHR: 180s bpm, variability: minimal ,  accelerations:  Abscent,  decelerations:  Present recurrent variable decels and occ late decels UC:   regular, every 2 minutes SVE:   10/100/+1 with caput   Labs: Lab Results  Component Value Date   WBC 7.2 11/17/2012   HGB 12.1 11/17/2012   HCT 36.3 11/17/2012   MCV 81.0 11/17/2012   PLT 199 11/17/2012    Assessment / Plan: Arrest of Descent Non reassuring FHR tracing  Labor: no progress Preeclampsia:  na Fetal Wellbeing:  Category III Pain Control:  Epidural I/D:  n/a Anticipated MOD:  Proceed with urgent cesarean section. Consent signed. OR notified of urgent status.  Dusty Raczkowski J 11/17/2012, 9:23 PM

## 2012-11-17 NOTE — H&P (Signed)
Virginia Guerrero              ACCOUNT NO.:  [id number]  MEDICAL RECORD NO.:  [id number]  LOCATION:  [facility]                          FACILITY:  [facility]  PHYSICIAN:  Lenoard Aden, M.D.DATE OF BIRTH:  [birth year]  DATE OF ADMISSION:  11/17/2012 DATE OF DISCHARGE:                             HISTORY & PHYSICAL   CHIEF COMPLAINT:  Pelvic pain, symptomatic hemorrhoids, favorable cervix for induction at 39 weeks.  She is a 32 year old African American female G3, P1 at [redacted] weeks gestation with aforementioned indications for induction.  ALLERGIES:  She has no known drug allergies.  MEDICATIONS:  Prenatal vitamins.  SOCIAL HISTORY:  She is a nonsmoker, nondrinker.  She denies domestic or physical violence.  SOCIAL HISTORY:  She has a noncontributory social history.  FAMILY HISTORY:  She has a family history of pancreatic cancer, lung cancer, colon cancer, breast cancer, lymphoma, hypertension.  OB/GYN HISTORY:  Previous history of vaginal delivery of a premature 4 pounds 12 ounce IUGR fetus at 39 weeks in 2007, unexplained.  Prenatal course otherwise uncomplicated except for GBS positivity.  PHYSICAL EXAMINATION:  GENERAL:  She is a well-developed, well-nourished [place] American female, in no acute distress. HEENT:  Normal. NECK:  Supple.  Full range of motion. LUNGS:  Clear. HEART:  Regular rhythm. ABDOMEN:  Soft, gravid, nontender.  Estimated fetal weight 7 to 7-1/2 pounds.  Cervix is 3 cm, 60%, posterior vertex -1. EXTREMITIES:  There are no cords. NEUROLOGIC:  Nonfocal. SKIN:  Intact.  IMPRESSION: 1. A 39-week intrauterine pregnancy. 2. Multiple somatic complaints. 3. Group B Streptococcus positive. 4. Favorable cervix.  PLAN:  Proceed with induction of epidural as needed.  Anticipate attempts at vaginal delivery.     Lenoard Aden, M.D.     RJT/MEDQ  D:  11/17/2012  T:  11/17/2012  Job:  [id number]

## 2012-11-17 NOTE — Anesthesia Preprocedure Evaluation (Signed)

## 2012-11-17 NOTE — Anesthesia Postprocedure Evaluation (Signed)
  Anesthesia Post Note  Patient: Virginia Guerrero  Procedure(s) Performed: Procedure(s) (LRB): CESAREAN SECTION (N/A)  Anesthesia type: Epidural  Patient location: PACU  Post pain: Pain level controlled  Post assessment: Post-op Vital signs reviewed  Last Vitals:  Filed Vitals:   11/17/12 2249  BP: 138/77  Pulse: 98  Temp: 37.4 C  Resp: 20    Post vital signs: Reviewed  Level of consciousness: awake  Complications: No apparent anesthesia complications

## 2012-11-18 ENCOUNTER — Encounter (HOSPITAL_COMMUNITY): Payer: Self-pay | Admitting: Obstetrics and Gynecology

## 2012-11-18 LAB — CBC
HCT: 32.2 % — ABNORMAL LOW (ref 36.0–46.0)
Hemoglobin: 10.6 g/dL — ABNORMAL LOW (ref 12.0–15.0)
MCV: 80.3 fL (ref 78.0–100.0)
WBC: 16.2 10*3/uL — ABNORMAL HIGH (ref 4.0–10.5)

## 2012-11-18 MED ORDER — NALOXONE HCL 1 MG/ML IJ SOLN
1.0000 ug/kg/h | INTRAVENOUS | Status: DC | PRN
  Filled 2012-11-18: qty 2

## 2012-11-18 MED ORDER — IBUPROFEN 600 MG PO TABS
600.0000 mg | ORAL_TABLET | Freq: Four times a day (QID) | ORAL | Status: DC
  Administered 2012-11-18 – 2012-11-20 (×8): 600 mg via ORAL
  Filled 2012-11-18 (×9): qty 1

## 2012-11-18 MED ORDER — DIPHENHYDRAMINE HCL 50 MG/ML IJ SOLN: 12.5000 mg | INTRAMUSCULAR | Status: DC | PRN

## 2012-11-18 MED ORDER — TETANUS-DIPHTH-ACELL PERTUSSIS 5-2.5-18.5 LF-MCG/0.5 IM SUSP
0.5000 mL | Freq: Once | INTRAMUSCULAR | Status: AC
  Administered 2012-11-18: 0.5 mL via INTRAMUSCULAR
  Filled 2012-11-18: qty 0.5

## 2012-11-18 MED ORDER — NALBUPHINE HCL 10 MG/ML IJ SOLN
5.0000 mg | INTRAMUSCULAR | Status: DC | PRN
  Filled 2012-11-18: qty 1

## 2012-11-18 MED ORDER — SIMETHICONE 80 MG PO CHEW: 80.0000 mg | CHEWABLE_TABLET | ORAL | Status: DC | PRN

## 2012-11-18 MED ORDER — ZOLPIDEM TARTRATE 5 MG PO TABS: 5.0000 mg | ORAL_TABLET | Freq: Every evening | ORAL | Status: DC | PRN

## 2012-11-18 MED ORDER — METHYLERGONOVINE MALEATE 0.2 MG PO TABS: 0.2000 mg | ORAL_TABLET | ORAL | Status: DC | PRN

## 2012-11-18 MED ORDER — METHYLERGONOVINE MALEATE 0.2 MG/ML IJ SOLN: 0.2000 mg | INTRAMUSCULAR | Status: DC | PRN

## 2012-11-18 MED ORDER — SENNOSIDES-DOCUSATE SODIUM 8.6-50 MG PO TABS
2.0000 | ORAL_TABLET | Freq: Every day | ORAL | Status: DC
  Administered 2012-11-18 – 2012-11-19 (×2): 2 via ORAL

## 2012-11-18 MED ORDER — NALOXONE HCL 0.4 MG/ML IJ SOLN: 0.4000 mg | INTRAMUSCULAR | Status: DC | PRN

## 2012-11-18 MED ORDER — DIPHENHYDRAMINE HCL 25 MG PO CAPS: 25.0000 mg | ORAL_CAPSULE | Freq: Four times a day (QID) | ORAL | Status: DC | PRN

## 2012-11-18 MED ORDER — ONDANSETRON HCL 4 MG/2ML IJ SOLN: 4.0000 mg | Freq: Three times a day (TID) | INTRAMUSCULAR | Status: DC | PRN

## 2012-11-18 MED ORDER — METOCLOPRAMIDE HCL 5 MG/ML IJ SOLN
INTRAMUSCULAR | Status: AC
  Filled 2012-11-18: qty 2

## 2012-11-18 MED ORDER — DIPHENHYDRAMINE HCL 25 MG PO CAPS
25.0000 mg | ORAL_CAPSULE | ORAL | Status: DC | PRN
  Filled 2012-11-18: qty 1

## 2012-11-18 MED ORDER — LANOLIN HYDROUS EX OINT: 1.0000 "application " | TOPICAL_OINTMENT | CUTANEOUS | Status: DC | PRN

## 2012-11-18 MED ORDER — DIPHENHYDRAMINE HCL 50 MG/ML IJ SOLN
INTRAMUSCULAR | Status: AC
  Filled 2012-11-18: qty 1

## 2012-11-18 MED ORDER — LACTATED RINGERS IV SOLN
INTRAVENOUS | Status: DC
  Administered 2012-11-18 (×2): via INTRAVENOUS

## 2012-11-18 MED ORDER — DIPHENHYDRAMINE HCL 50 MG/ML IJ SOLN: 25.0000 mg | INTRAMUSCULAR | Status: DC | PRN

## 2012-11-18 MED ORDER — WITCH HAZEL-GLYCERIN EX PADS
1.0000 "application " | MEDICATED_PAD | CUTANEOUS | Status: DC | PRN
  Administered 2012-11-19: 1 via TOPICAL

## 2012-11-18 MED ORDER — ONDANSETRON HCL 4 MG/2ML IJ SOLN: 4.0000 mg | INTRAMUSCULAR | Status: DC | PRN

## 2012-11-18 MED ORDER — ACETAMINOPHEN 10 MG/ML IV SOLN
1000.0000 mg | Freq: Four times a day (QID) | INTRAVENOUS | Status: AC | PRN
  Filled 2012-11-18: qty 100

## 2012-11-18 MED ORDER — OXYCODONE-ACETAMINOPHEN 5-325 MG PO TABS
1.0000 | ORAL_TABLET | ORAL | Status: DC | PRN
  Administered 2012-11-18: 1 via ORAL
  Administered 2012-11-18: 2 via ORAL
  Administered 2012-11-18 (×2): 1 via ORAL
  Administered 2012-11-19: 2 via ORAL
  Administered 2012-11-19: 1 via ORAL
  Administered 2012-11-19 – 2012-11-20 (×3): 2 via ORAL
  Filled 2012-11-18 (×2): qty 1
  Filled 2012-11-18 (×3): qty 2
  Filled 2012-11-18: qty 1
  Filled 2012-11-18: qty 2
  Filled 2012-11-18: qty 1
  Filled 2012-11-18: qty 2

## 2012-11-18 MED ORDER — PRENATAL MULTIVITAMIN CH
1.0000 | ORAL_TABLET | Freq: Every day | ORAL | Status: DC
  Administered 2012-11-19 – 2012-11-20 (×2): 1 via ORAL
  Filled 2012-11-18 (×3): qty 1

## 2012-11-18 MED ORDER — DIBUCAINE 1 % RE OINT
1.0000 "application " | TOPICAL_OINTMENT | RECTAL | Status: DC | PRN
  Administered 2012-11-19: 1 via RECTAL
  Filled 2012-11-18: qty 28

## 2012-11-18 MED ORDER — OXYTOCIN 40 UNITS IN LACTATED RINGERS INFUSION - SIMPLE MED: 62.5000 mL/h | INTRAVENOUS | Status: AC

## 2012-11-18 MED ORDER — MENTHOL 3 MG MT LOZG: 1.0000 | LOZENGE | OROMUCOSAL | Status: DC | PRN

## 2012-11-18 MED ORDER — SIMETHICONE 80 MG PO CHEW
80.0000 mg | CHEWABLE_TABLET | Freq: Three times a day (TID) | ORAL | Status: DC
  Administered 2012-11-18 – 2012-11-20 (×6): 80 mg via ORAL

## 2012-11-18 MED ORDER — SODIUM CHLORIDE 0.9 % IJ SOLN: 3.0000 mL | INTRAMUSCULAR | Status: DC | PRN

## 2012-11-18 MED ORDER — ONDANSETRON HCL 4 MG PO TABS: 4.0000 mg | ORAL_TABLET | ORAL | Status: DC | PRN

## 2012-11-18 NOTE — Progress Notes (Signed)
Patient ID: Carlo Lorson, female   DOB: August 24, 1980, 32 y.o.   MRN: [id number] POD # 1  Subjective: Pt reports feeling well, tired/ Pain controlled with ibuprofen and intra op meds Tolerating po/ Foley patent/Voiding without problems/ No n/v/Flatus pos Activity: up with assistance Bleeding is light Newborn info:  Information for the patient's newborn:  Carynn, Felling Girl Shuntia [[id number]]  female Feeding: breast   Objective: VS: Blood pressure 131/86, pulse 96, temperature 98.7 F (37.1 C), temperature source Axillary, resp. rate 20.    Intake/Output Summary (Last 24 hours) at 11/18/12 1030 Last data filed at 11/18/12 0651  Gross per 24 hour  Intake   2400 ml  Output   2600 ml  Net   -200 ml      Recent Labs  11/17/12 0720  WBC 7.2  HGB 12.1  HCT 36.3  PLT 199    Blood type: AB POS, AB POS (05/20 0732) Rubella: Immune (10/22 0000)    Physical Exam:  General: alert, cooperative and no distress CV: Regular rate and rhythm Resp: clear Abdomen: soft, nontender, normal bowel sounds Incision: Covered with Tegaderm and honeycomb dressing; well approximated. Uterine Fundus: firm, below umbilicus, nontender Lochia: minimal Ext: edema +1 and Homans sign is negative, no sign of DVT    A/P: POD # 1/ G3P2012 S/P Primary C/Section d/t NRFHR Doing well Continue routine post op orders   Signed: Demetrius Revel, MSN, [facility] 11/18/2012, 10:30 AM

## 2012-11-18 NOTE — Anesthesia Postprocedure Evaluation (Signed)
  Anesthesia Post-op Note  Patient: Virginia Guerrero  Procedure(s) Performed: Procedure(s): CESAREAN SECTION (N/A)  Patient Location: Mother/Baby  Anesthesia Type:Epidural  Level of Consciousness: awake, alert , oriented and patient cooperative  Airway and Oxygen Therapy: Patient Spontanous Breathing  Post-op Pain: mild  Post-op Assessment: Patient's Cardiovascular Status Stable, Respiratory Function Stable, No headache, No backache, No residual numbness and No residual motor weakness  Post-op Vital Signs: stable  Complications: No apparent anesthesia complications
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
## 2012-11-19 NOTE — Progress Notes (Signed)
Patient ID: Virginia Guerrero, female   DOB: 08-Jun-1981, 32 y.o.   MRN: [id number] POD # 2  Subjective: Pt reports feeling "ok".  / Mod pain controlled with ibuprofen and rare percocet Tolerating po/Voiding without problems/ No n/v/Flatus neg Activity: out of bed and ambulate Bleeding is light Newborn info:  Information for the patient's newborn:  Jaianna, Nicoll Girl [name] [[id number]]  female Feeding: breast   Objective: VS: Blood pressure 121/80, pulse 85, temperature 98 F (36.7 C), temperature source Oral, resp. rate 18.    Physical Exam:  General: alert, cooperative and no distress CV: Regular rate and rhythm Resp: clear Abdomen: soft, nontender, normal bowel sounds Uterine Fundus: firm, below umbilicus, nontender Incision: Covered with Tegaderm and honeycomb dressing; well approximated. Lochia: minimal Ext: edema +1 and Homans sign is negative, no sign of DVT    A/P: POD # 2/ G3P2012/ S/P Primary C/Section d/t fetal distress Doing well Improve pain control today and increase ambulation and warm po fluids Continue routine post op orders Anticipate discharge home in the am   Signed: Demetrius Revel, MSN, [facility] 11/19/2012, 6:51 AM

## 2012-11-20 MED ORDER — OXYCODONE-ACETAMINOPHEN 5-325 MG PO TABS: 1.0000 | ORAL_TABLET | ORAL | Status: DC | PRN

## 2012-11-20 MED ORDER — IBUPROFEN 600 MG PO TABS: 600.0000 mg | ORAL_TABLET | Freq: Four times a day (QID) | ORAL | Status: DC

## 2012-11-20 NOTE — Discharge Summary (Signed)
Obstetric Discharge Summary  Reason for Admission: induction of labor Prenatal Procedures: none Intrapartum Procedures: cesarean: low cervical, transverse Postpartum Procedures: none Complications-Operative and Postpartum:  mild ABL anemia - stable Hemoglobin  Date Value Range Status  11/18/2012 10.6* 12.0 - 15.0 g/dL Final     HCT  Date Value Range Status  11/18/2012 32.2* 36.0 - 46.0 % Final    Physical Exam:  General: alert, cooperative and no distress Lochia: appropriate Uterine Fundus: firm Incision: healing well - honeycomb dressing intact - staples in place without erythema or drainage DVT Evaluation: No evidence of DVT seen on physical exam.  Discharge Diagnoses: Term Pregnancy-delivered  / POD 3 s/p CS for NR-FHR / mild ABL anemia  Discharge Information: Date: 11/20/2012 Activity: pelvic rest Diet: routine Medications: PNV, Ibuprofen, Colace and Percocet Condition: stable Instructions: refer to practice specific booklet / staple removal at [facility] - call for apt Discharge to: home Follow-up Information   Follow up with Lenoard Aden, MD. Schedule an appointment as soon as possible for a visit in 6 weeks. (staple removal Tuesday)    Contact information:   [address] [place] [address] [phone]       Newborn Data: Live born female  Birth Weight: 6 lb 11.8 oz (3055 g) APGAR: 9, 9  Home with mother.  Marlinda Mike 11/20/2012, 11:13 AM

## 2012-11-20 NOTE — Progress Notes (Signed)
POSTOPERATIVE DAY # 3 S/P CS   S:         Reports feeling well             Tolerating po intake / no nausea / no vomiting / +flatus / + BM             Bleeding is light             Pain controlled withmotrin and percocet             Up ad lib / ambulatory/ voiding QS  Newborn breast feeding   O:  VS: BP 134/73  Pulse 78  Temp(Src) 98.5 F (36.9 C) (Oral)  Resp 18  Ht 5\' 4"  (1.626 m)  Wt 95.709 kg (211 lb)  BMI 36.2 kg/m2  SpO2 98%  LMP 02/18/2012                Physical Exam:             Alert and Oriented X3  Lungs: Clear and unlabored  Heart: regular rate and rhythm / no mumurs  Abdomen: soft, non-tender, non-distended active BS             Fundus: firm, non-tender, Ueven             Dressing intact             Perineum: no edema  Lochia: light  Extremities: 1+ pedal edema, no calf pain or tenderness, neg Homans  A:        POD # 3 S/P cs            Mild ABl anemia  P:        Routine postoperative care                   Marlinda Mike CNM, MSN 11/20/2012, 11:06 AM

## 2013-08-17 ENCOUNTER — Emergency Department (HOSPITAL_COMMUNITY)
Admission: EM | Admit: 2013-08-17 | Discharge: 2013-08-17 | Disposition: A | Discharge status: Home / Self Care | Payer: 59 | Attending: Emergency Medicine | Admitting: Emergency Medicine

## 2013-08-17 ENCOUNTER — Encounter (HOSPITAL_COMMUNITY): Payer: Self-pay | Admitting: Emergency Medicine

## 2013-08-17 DIAGNOSIS — Z79899 Other long term (current) drug therapy: Secondary | ICD-10-CM | POA: Insufficient documentation

## 2013-08-17 DIAGNOSIS — Z87891 Personal history of nicotine dependence: Secondary | ICD-10-CM | POA: Insufficient documentation

## 2013-08-17 DIAGNOSIS — H6120 Impacted cerumen, unspecified ear: Secondary | ICD-10-CM | POA: Insufficient documentation

## 2013-08-17 DIAGNOSIS — J329 Chronic sinusitis, unspecified: Secondary | ICD-10-CM | POA: Insufficient documentation

## 2013-08-17 MED ORDER — CARBAMIDE PEROXIDE 6.5 % OT SOLN: 5.0000 [drp] | Freq: Two times a day (BID) | OTIC | Status: DC

## 2013-08-17 NOTE — ED Notes (Signed)
Pt states sinus issues for a week and a half. Pt states her ears are stopped up and she has been having post nasal drainage. Pt concerned for ears today.

## 2013-08-17 NOTE — ED Notes (Signed)
Pt c/o ear congestion due to nasal congestion; reports at end stage of cold; no throat pain or cough.

## 2013-08-17 NOTE — ED Notes (Signed)
PA at bedside.

## 2013-08-17 NOTE — ED Notes (Signed)
PT ambulated with baseline gait; VSS; A&Ox3; no signs of distress; respirations even and unlabored; skin warm and dry; no questions upon discharge.  

## 2013-08-17 NOTE — Discharge Instructions (Signed)
Cerumen Plug A cerumen plug is having too much wax in your ear canal. The outer ear canal is lined with hairs and glands that secrete wax. This wax is called cerumen. This protects the ear canal. It also helps prevent material from entering the ear. Too much wax can cause a feeling of fullness in the ears, decreased hearing, ringing in the ears, or an earache. Sometimes your caregiver will remove a cerumen plug with an instrument called a curette. Or he/she may flush the ear canal with warm water from a syringe to remove the wax. You may simply be sent home to follow the home care instructions below for wax removal. Generally ear wax does not have to be removed unless it is causing a problem such as one of those listed above. When too much wax is causing a problem, the following are a few home remedies which can be used to help this problem. HOME CARE INSTRUCTIONS   Put a couple drops of glycerin, baby oil, or mineral oil in the ear a couple times of day. Do this every day for several days. After putting the drops in, you will need to lay with the affected ear pointing up for a couple minutes. This allows the drops to remain in the canal and run down to the area of wax blockage. This will soften the wax plug. It may also make your hearing worse as the wax softens and blocks the canal even more.  After a couple days, you may gently flush the ear canal with warm water from a syringe. Do this by pulling your ear up and back with your head tilted slightly forward and towards a pan to catch the water. This is most easily done with a helper. You can also accomplish the same thing by letting the shower beat into your ear canal to wash the wax out. Sometimes this will not be immediately successful. You will have to return to the first step of using the oil to further soften the wax. Then resume washing the ear canal out with a syringe or shower.  Following removal of the wax, put ten to twenty drops of rubbing  alcohol into the outer ears. This will dry the canal and prevent an infection.  Do not irrigate or wash out your ears if you have had a perforated ear drum or mastoid surgery. SEEK IMMEDIATE MEDICAL CARE IF:   You are unsuccessful with the above instructions for home care.  You develop ear pain or drainage from the ear. MAKE SURE YOU:   Understand these instructions.  Will watch your condition.  Will get help right away if you are not doing well or get worse. Document Released: 03/12/2001 Document Revised: 09/09/2011 Document Reviewed: 06/08/2008 ExitCare Patient Information 2014 [employer].  

## 2013-08-17 NOTE — ED Provider Notes (Signed)
CSN: [id number]     Arrival date & time 08/17/13  2021 History   First MD Initiated Contact with Patient 08/17/13 2114     Chief Complaint  Patient presents with  . Otalgia  . Sinusitis     (Consider location/radiation/quality/duration/timing/severity/associated sxs/prior Treatment) HPI  Patient to the ER with complaints of build up of cerumen to bilateral ears. She has tried using hydrogen peroxide at home and a bobby pen but it has not been helping because the ear wax comes right back. She denies having pain or fever but says it is uncomfortable. She normally goes to Urgent Care for this and they use the "elephant ears" to wash out the cerumen. She was informed in triage that we do not lavage ears in the ED and is unsure if she wants to see a provider. Ultimately,she decided she did.   Past Medical History  Diagnosis Date  . No pertinent past medical history   . Abortion, nontherapeutic     in a clinic  . Hx of chest pain   . Carpal tunnel syndrome   . Infections of genitourinary tract in pregnancy, unspecified as to episode of care(646.60)   . Syncope and collapse   . Painful respiration     costochondral pain   Past Surgical History  Procedure Laterality Date  . Induced abortion      in a clinic  . Cesarean section N/A 11/17/2012    Procedure: CESAREAN SECTION;  Surgeon: Lenoard Aden, MD;  Location: [facility];  Service: Obstetrics;  Laterality: N/A;   Family History  Problem Relation Age of Onset  . Arthritis Other   . Hypertension Father   . Cancer Paternal Uncle     pancreatic  . Hypertension Maternal Grandfather   . Cancer Paternal Grandmother     breast   History  Substance Use Topics  . Smoking status: Former Smoker    Types: Cigarettes  . Smokeless tobacco: Not on file  . Alcohol Use: No     Comment: not with pregnancy   OB History   Grav Para Term Preterm Abortions TAB SAB Ect Mult Living   3 2 2  0 1 1 0 0 0 2     Review of Systems  The patient  denies anorexia, fever, weight loss,, vision loss, decreased hearing, hoarseness, chest pain, syncope, dyspnea on exertion, peripheral edema, balance deficits, hemoptysis, abdominal pain, melena, hematochezia, severe indigestion/heartburn, hematuria, incontinence, genital sores, muscle weakness, suspicious skin lesions, transient blindness, difficulty walking, depression, unusual weight change, abnormal bleeding, enlarged lymph nodes, angioedema, and breast masses.   Allergies  Review of patient's allergies indicates no known allergies.  Home Medications   Current Outpatient Rx  Name  Route  Sig  Dispense  Refill  . Prenatal Vit-Fe Fumarate-FA (PRENATAL MULTIVITAMIN) TABS   Oral   Take 1 tablet by mouth daily at 12 noon.         . carbamide peroxide (DEBROX) 6.5 % otic solution   Both Ears   Place 5 drops into both ears 2 (two) times daily.   15 mL   0    BP 157/97  Pulse 77  Temp(Src) 98.2 F (36.8 C) (Oral)  Resp 16  Ht 5\' 5"  (1.651 m)  Wt 193 lb 5 oz (87.686 kg)  BMI 32.17 kg/m2  SpO2 99% Physical Exam  Nursing note and vitals reviewed. Constitutional: She appears well-developed and well-nourished. No distress.  HENT:  Head: Normocephalic and atraumatic.  Right Ear: Tympanic  membrane normal.  Left Ear: Tympanic membrane and ear canal normal.  Pt has soft cerumen to right ear canal, I am able to parietally visualize the ear drum  Eyes: Pupils are equal, round, and reactive to light.  Neck: Normal range of motion. Neck supple.  Cardiovascular: Normal rate and regular rhythm.   Pulmonary/Chest: Effort normal.  Abdominal: Soft.  Neurological: She is alert.  Skin: Skin is warm and dry.    ED Course  Procedures (including critical care time) Labs Review Labs Reviewed - No data to display Imaging Review No results found.  EKG Interpretation   None       MDM   Final diagnoses:  Ceruminosis    Rx. Debrox. Pt requests referral to ENT which has been  given  Patient education given on how to safely clean ears at home..  33 y.o.Virginia Guerrero's evaluation in the Emergency Department is complete. It has been determined that no acute conditions requiring further emergency intervention are present at this time. The patient/guardian have been advised of the diagnosis and plan. We have discussed signs and symptoms that warrant return to the ED, such as changes or worsening in symptoms.  Vital signs are stable at discharge. Filed Vitals:   08/17/13 2027  BP: 157/97  Pulse: 77  Temp: 98.2 F (36.8 C)  Resp: 16    Patient/guardian has voiced understanding and agreed to follow-up with the PCP or specialist.     Dorthula Matasiffany G Idamay Hosein, PA-C 08/17/13 2144

## 2013-08-18 NOTE — ED Provider Notes (Signed)
Medical screening examination/treatment/procedure(s) were performed by non-physician practitioner and as supervising physician I was immediately available for consultation/collaboration.  Flint MelterElliott L Mata Rowen, MD 08/18/13 [id number]

## 2016-09-10 ENCOUNTER — Encounter (HOSPITAL_COMMUNITY): Payer: Self-pay | Admitting: Emergency Medicine

## 2016-09-10 ENCOUNTER — Emergency Department (HOSPITAL_COMMUNITY)
Admission: EM | Admit: 2016-09-10 | Discharge: 2016-09-10 | Disposition: A | Discharge status: Home / Self Care | Payer: Medicaid Other | Attending: Dermatology | Admitting: Dermatology

## 2016-09-10 ENCOUNTER — Other Ambulatory Visit: Payer: Self-pay

## 2016-09-10 DIAGNOSIS — Z5321 Procedure and treatment not carried out due to patient leaving prior to being seen by health care provider: Secondary | ICD-10-CM | POA: Diagnosis not present

## 2016-09-10 DIAGNOSIS — R55 Syncope and collapse: Secondary | ICD-10-CM | POA: Insufficient documentation

## 2016-09-10 LAB — CBC
HEMATOCRIT: 33.3 % — AB (ref 36.0–46.0)
Hemoglobin: 10.8 g/dL — ABNORMAL LOW (ref 12.0–15.0)
MCH: 26.2 pg (ref 26.0–34.0)
MCHC: 32.4 g/dL (ref 30.0–36.0)
MCV: 80.6 fL (ref 78.0–100.0)
Platelets: 279 10*3/uL (ref 150–400)
RBC: 4.13 MIL/uL (ref 3.87–5.11)
RDW: 14.4 % (ref 11.5–15.5)
WBC: 5.7 10*3/uL (ref 4.0–10.5)

## 2016-09-10 LAB — I-STAT BETA HCG BLOOD, ED (MC, WL, AP ONLY): I-stat hCG, quantitative: >2000 m[IU]/mL — ABNORMAL HIGH (ref <5)

## 2016-09-10 LAB — BASIC METABOLIC PANEL
Anion gap: 9 (ref 5–15)
BUN: <5 mg/dL — ABNORMAL LOW (ref 6–20)
CHLORIDE: 103 mmol/L (ref 101–111)
CO2: 25 mmol/L (ref 22–32)
Calcium: 9.1 mg/dL (ref 8.9–10.3)
Creatinine, Ser: 0.5 mg/dL (ref 0.44–1.00)
GFR calc Af Amer: >60 mL/min (ref >60)
GFR calc non Af Amer: >60 mL/min (ref >60)
GLUCOSE: 94 mg/dL (ref 65–99)
POTASSIUM: 3.3 mmol/L — AB (ref 3.5–5.1)
Sodium: 137 mmol/L (ref 135–145)

## 2016-09-10 LAB — HCG, QUANTITATIVE, PREGNANCY: hCG, Beta Chain, Quant, S: 68446 m[IU]/mL — ABNORMAL HIGH (ref <5)

## 2016-09-10 NOTE — ED Notes (Signed)
Called pt to obtain vitals, pt did not respond. x3.

## 2016-09-10 NOTE — ED Triage Notes (Signed)
Pt reports two syncopal episodes today, states she is pregnant, thinks she may be about [redacted] weeks along. Pt a/ox4, resp e/u, nad.

## 2016-09-10 NOTE — ED Notes (Signed)
Pt did not answer when called for rooming.   

## 2016-09-10 NOTE — ED Notes (Signed)
Patient did not answer when called x 3 

## 2016-09-11 ENCOUNTER — Emergency Department (HOSPITAL_BASED_OUTPATIENT_CLINIC_OR_DEPARTMENT_OTHER): Payer: Medicaid Other

## 2016-09-11 ENCOUNTER — Encounter (HOSPITAL_BASED_OUTPATIENT_CLINIC_OR_DEPARTMENT_OTHER): Payer: Self-pay

## 2016-09-11 ENCOUNTER — Emergency Department (HOSPITAL_BASED_OUTPATIENT_CLINIC_OR_DEPARTMENT_OTHER)
Admission: EM | Admit: 2016-09-11 | Discharge: 2016-09-11 | Disposition: A | Discharge status: Home / Self Care | Payer: Medicaid Other | Attending: Emergency Medicine | Admitting: Emergency Medicine

## 2016-09-11 DIAGNOSIS — O2341 Unspecified infection of urinary tract in pregnancy, first trimester: Secondary | ICD-10-CM

## 2016-09-11 DIAGNOSIS — R1032 Left lower quadrant pain: Secondary | ICD-10-CM

## 2016-09-11 DIAGNOSIS — Z87891 Personal history of nicotine dependence: Secondary | ICD-10-CM | POA: Insufficient documentation

## 2016-09-11 DIAGNOSIS — Z3A09 9 weeks gestation of pregnancy: Secondary | ICD-10-CM | POA: Insufficient documentation

## 2016-09-11 LAB — CBC WITH DIFFERENTIAL/PLATELET
BASOS ABS: 0 10*3/uL (ref 0.0–0.1)
Basophils Relative: 0 %
EOS ABS: 0.1 10*3/uL (ref 0.0–0.7)
Eosinophils Relative: 3 %
HCT: 32.4 % — ABNORMAL LOW (ref 36.0–46.0)
HEMOGLOBIN: 10.8 g/dL — AB (ref 12.0–15.0)
LYMPHS ABS: 1.1 10*3/uL (ref 0.7–4.0)
LYMPHS PCT: 26 %
MCH: 26.7 pg (ref 26.0–34.0)
MCHC: 33.3 g/dL (ref 30.0–36.0)
MCV: 80 fL (ref 78.0–100.0)
Monocytes Absolute: 0.5 10*3/uL (ref 0.1–1.0)
Monocytes Relative: 13 %
NEUTROS PCT: 58 %
Neutro Abs: 2.5 10*3/uL (ref 1.7–7.7)
Platelets: 248 10*3/uL (ref 150–400)
RBC: 4.05 MIL/uL (ref 3.87–5.11)
RDW: 14.5 % (ref 11.5–15.5)
WBC: 4.3 10*3/uL (ref 4.0–10.5)

## 2016-09-11 LAB — URINALYSIS, ROUTINE W REFLEX MICROSCOPIC
Bilirubin Urine: NEGATIVE
Glucose, UA: NEGATIVE mg/dL
HGB URINE DIPSTICK: NEGATIVE
Ketones, ur: NEGATIVE mg/dL
NITRITE: POSITIVE — AB
Protein, ur: NEGATIVE mg/dL
SPECIFIC GRAVITY, URINE: 1.021 (ref 1.005–1.030)
pH: 7 (ref 5.0–8.0)

## 2016-09-11 LAB — BASIC METABOLIC PANEL
Anion gap: 6 (ref 5–15)
BUN: 8 mg/dL (ref 6–20)
CHLORIDE: 104 mmol/L (ref 101–111)
CO2: 24 mmol/L (ref 22–32)
CREATININE: 0.54 mg/dL (ref 0.44–1.00)
Calcium: 9.1 mg/dL (ref 8.9–10.3)
GFR calc non Af Amer: >60 mL/min (ref >60)
Glucose, Bld: 89 mg/dL (ref 65–99)
POTASSIUM: 3.1 mmol/L — AB (ref 3.5–5.1)
SODIUM: 134 mmol/L — AB (ref 135–145)

## 2016-09-11 LAB — URINALYSIS, MICROSCOPIC (REFLEX)

## 2016-09-11 LAB — WET PREP, GENITAL
SPERM: NONE SEEN
Trich, Wet Prep: NONE SEEN
Yeast Wet Prep HPF POC: NONE SEEN

## 2016-09-11 MED ORDER — CEPHALEXIN 500 MG PO CAPS: 500.0000 mg | ORAL_CAPSULE | Freq: Four times a day (QID) | ORAL | 0 refills | Status: AC

## 2016-09-11 NOTE — ED Triage Notes (Signed)
C/o abd pain, vaginal d/c x 2 days-pt thinks she is approx [redacted] weeks pregnant-no medical tx-[facility]NAD-steady gait

## 2016-09-11 NOTE — ED Provider Notes (Signed)
[facility] Provider Note   CSN: [id number] Arrival date & time: 09/11/16  1229     History   Chief Complaint Chief Complaint  Patient presents with  . Abdominal Pain    HPI Virginia Guerrero is a 36 y.o. female [id number] who presents to the Emergency Department with complaints of LLQ abdominal pain. She reports the non-radiating pain started as intermittent crampy pain 8 weeks ago, but has continued to worsen and become sharp over the last 2 days. She states she has had cramping pain in the same area, but the character of the pain changed over the last two days. She also reports associated low back pain and vaginal discharge that has been both white and clear. She denies vaginal bleeding, vaginal pain or itching, fever, chills, CP, HA, dysuria, and rash.  No history of STDs. She has been in a monogamous with the father of her children, which recently ended. She reports her only complication with her previous pregnancies was recurrent UTIs. No chronic medical problems for which she takes daily medications. Former smoker everyday smoker, but quit in the last two months. No recreational drug or alcohol use.   HPI  Past Medical History:  Diagnosis Date  . Abortion, nontherapeutic    in a clinic  . Carpal tunnel syndrome   . Hx of chest pain   . Infections of genitourinary tract in pregnancy, unspecified as to episode of care(646.60)   . No pertinent past medical history   . Painful respiration    costochondral pain  . Syncope and collapse     Patient Active Problem List   Diagnosis Date Noted  . Anemia 06/20/2011  . Anxiety and depression 06/20/2011    Past Surgical History:  Procedure Laterality Date  . CESAREAN SECTION N/A 11/17/2012   Procedure: CESAREAN SECTION;  Surgeon: Lenoard Aden, MD;  Location: [facility];  Service: Obstetrics;  Laterality: N/A;  . INDUCED ABORTION     in a clinic    OB History    Gravida Para Term Preterm AB Living   4 2 2  0 1 2   SAB TAB Ectopic Multiple Live Births   0 1 0 0 2       Home Medications    Prior to Admission medications   Not on File    Family History Family History  Problem Relation Age of Onset  . Arthritis Other   . Hypertension Father   . Cancer Paternal Uncle     pancreatic  . Hypertension Maternal Grandfather   . Cancer Paternal Grandmother     breast    Social History Social History  Substance Use Topics  . Smoking status: Former Smoker    Types: Cigarettes  . Smokeless tobacco: Never Used  . Alcohol use No     Comment: not with pregnancy     Allergies   Patient has no known allergies.   Review of Systems Review of Systems  Constitutional: Negative for chills and fever.  HENT: Negative for congestion.   Respiratory: Negative for chest tightness and shortness of breath.   Cardiovascular: Negative for chest pain.  Gastrointestinal: Positive for abdominal pain. Negative for blood in stool, constipation, diarrhea, nausea and vomiting.  Genitourinary: Positive for vaginal discharge. Negative for dysuria, hematuria, pelvic pain, vaginal bleeding and vaginal pain.  Musculoskeletal: Positive for back pain.  Skin: Negative for rash.  Neurological: Positive for dizziness (resolved). Negative for headaches.  Hematological: Negative for adenopathy.   Physical Exam Updated  Vital Signs BP 127/89 (BP Location: Left Arm)   Pulse 96   Temp 98.7 F (37.1 C) (Oral)   Resp 16   Ht 5\' 4"  (1.626 m)   Wt 66.7 kg   LMP 07/06/2016 (Exact Date)   SpO2 100%   BMI 25.23 kg/m   Physical Exam  Constitutional: She is oriented to person, place, and time. She appears well-developed and well-nourished. No distress.  HENT:  Head: Normocephalic and atraumatic.  Eyes: Conjunctivae are normal.  Neck: Normal range of motion. Neck supple.  Cardiovascular: Normal rate, regular rhythm, normal heart sounds and intact distal pulses.  Exam reveals no gallop and no friction rub.   No murmur  heard. Pulmonary/Chest: Effort normal and breath sounds normal. No respiratory distress. She has no wheezes. She has no rales.  Abdominal: Soft. Bowel sounds are normal. She exhibits no distension and no mass. There is tenderness in the suprapubic area. There is guarding. There is no rebound and no CVA tenderness.  Reproducible left-sided suprapubic tenderness to palpation with some guarding.   Genitourinary: Pelvic exam was performed with patient prone. There is no rash, tenderness or lesion on the right labia. There is no rash, tenderness or lesion on the left labia.  Genitourinary Comments: Chaperoned exam. Cervix is closed. No blood in the vaginal vault.  Musculoskeletal: Normal range of motion. She exhibits no edema or deformity.  Neurological: She is alert and oriented to person, place, and time.  Skin: Skin is warm and dry. She is not diaphoretic. No erythema.  Psychiatric: Her behavior is normal. Judgment and thought content normal.  Nursing note and vitals reviewed.  ED Treatments / Results  Labs (all labs ordered are listed, but only abnormal results are displayed) Labs Reviewed  BASIC METABOLIC PANEL  CBC WITH DIFFERENTIAL/PLATELET  URINALYSIS, ROUTINE W REFLEX MICROSCOPIC  GC/CHLAMYDIA PROBE AMP ([place]) NOT AT [facility]   EKG  EKG Interpretation None      Radiology No results found.  Procedures Procedures (including critical care time)  Medications Ordered in ED Medications - No data to display   Initial Impression / Assessment and Plan / ED Course  I have reviewed the triage vital signs and the nursing notes.  Pertinent labs & imaging results that were available during my care of the patient were reviewed by me and considered in my medical decision making (see chart for details).    - 13:15 Completed initial evaluation on the patient. Ordered Transvaginal US, CBC, CMP, GC/Chlaymydia, and Wet Prep.  - 1:25 Re-evaluated the patient with attending Dr. Criss Alvine.  Performed bedside transabominal [place].   Final Clinical Impressions(s) / ED Diagnoses   Final diagnoses:  LLQ abdominal pain  LLQ abdominal pain   Virginia Guerrero is a 36 y.o. female with left-sided, stabbing suprapubic pain that has worsened over the last few days. She is approximately 11-[redacted] weeks pregnant. No prenatal care to date with this pregnancy. LMP is 07/06/16.  The patient reports she has recently been under an increased amount of stress, including ending her relationship with the father of her baby.   Ddx includes ovarian cyst, ectopic pregnancy, PID.  No prenatal care to date. Transvaginal US demonstrates an intrauterine gestational sac with normal ovaries. Low concern for ectopic pregnancy. The patient is well-appearing. Afebrile. Non-toxic. UA nitrate and leukocyte esterase positive. Will treat with Keflex, give referral to OB and discharge home.   New Prescriptions New Prescriptions   No medications on file     Virgen Belland Conan Bowens,  PA-C 09/12/16 1500    Pricilla LovelessScott Goldston, MD 09/21/16 [phone]

## 2016-09-12 LAB — GC/CHLAMYDIA PROBE AMP (~~LOC~~) NOT AT ARMC
CHLAMYDIA, DNA PROBE: NEGATIVE
Neisseria Gonorrhea: NEGATIVE

## 2016-09-12 LAB — HIV ANTIBODY (ROUTINE TESTING W REFLEX): HIV SCREEN 4TH GENERATION: NONREACTIVE

## 2016-10-21 ENCOUNTER — Emergency Department
Admission: EM | Admit: 2016-10-21 | Discharge: 2016-10-21 | Disposition: A | Discharge status: Home / Self Care | Payer: Medicaid Other | Attending: Emergency Medicine | Admitting: Emergency Medicine

## 2016-10-21 DIAGNOSIS — O219 Vomiting of pregnancy, unspecified: Secondary | ICD-10-CM | POA: Insufficient documentation

## 2016-10-21 DIAGNOSIS — O209 Hemorrhage in early pregnancy, unspecified: Secondary | ICD-10-CM | POA: Diagnosis present

## 2016-10-21 DIAGNOSIS — Z79899 Other long term (current) drug therapy: Secondary | ICD-10-CM | POA: Insufficient documentation

## 2016-10-21 DIAGNOSIS — Z3A16 16 weeks gestation of pregnancy: Secondary | ICD-10-CM | POA: Diagnosis not present

## 2016-10-21 DIAGNOSIS — R109 Unspecified abdominal pain: Secondary | ICD-10-CM

## 2016-10-21 DIAGNOSIS — Z87891 Personal history of nicotine dependence: Secondary | ICD-10-CM | POA: Diagnosis not present

## 2016-10-21 DIAGNOSIS — R112 Nausea with vomiting, unspecified: Secondary | ICD-10-CM

## 2016-10-21 DIAGNOSIS — R1010 Upper abdominal pain, unspecified: Secondary | ICD-10-CM | POA: Diagnosis not present

## 2016-10-21 LAB — CBC
HEMATOCRIT: 34 % — AB (ref 35.0–47.0)
Hemoglobin: 11.5 g/dL — ABNORMAL LOW (ref 12.0–16.0)
MCH: 27.6 pg (ref 26.0–34.0)
MCHC: 33.8 g/dL (ref 32.0–36.0)
MCV: 81.7 fL (ref 80.0–100.0)
Platelets: 245 10*3/uL (ref 150–440)
RBC: 4.16 MIL/uL (ref 3.80–5.20)
RDW: 14.9 % — ABNORMAL HIGH (ref 11.5–14.5)
WBC: 10.1 10*3/uL (ref 3.6–11.0)

## 2016-10-21 LAB — LIPASE, BLOOD: Lipase: 17 U/L (ref 11–51)

## 2016-10-21 LAB — HCG, QUANTITATIVE, PREGNANCY: hCG, Beta Chain, Quant, S: 39521 m[IU]/mL — ABNORMAL HIGH (ref <5)

## 2016-10-21 LAB — COMPREHENSIVE METABOLIC PANEL
ALBUMIN: 3.8 g/dL (ref 3.5–5.0)
ALK PHOS: 47 U/L (ref 38–126)
ALT: 11 U/L — ABNORMAL LOW (ref 14–54)
ANION GAP: 6 (ref 5–15)
AST: 17 U/L (ref 15–41)
BILIRUBIN TOTAL: 0.8 mg/dL (ref 0.3–1.2)
BUN: 9 mg/dL (ref 6–20)
CALCIUM: 9.1 mg/dL (ref 8.9–10.3)
CO2: 23 mmol/L (ref 22–32)
Chloride: 106 mmol/L (ref 101–111)
Creatinine, Ser: 0.57 mg/dL (ref 0.44–1.00)
GFR calc Af Amer: >60 mL/min (ref >60)
GFR calc non Af Amer: >60 mL/min (ref >60)
GLUCOSE: 110 mg/dL — AB (ref 65–99)
POTASSIUM: 3.2 mmol/L — AB (ref 3.5–5.1)
Sodium: 135 mmol/L (ref 135–145)
Total Protein: 7.1 g/dL (ref 6.5–8.1)

## 2016-10-21 LAB — TYPE AND SCREEN
ABO/RH(D): AB POS
Antibody Screen: NEGATIVE

## 2016-10-21 MED ORDER — METOCLOPRAMIDE HCL 5 MG/ML IJ SOLN
10.0000 mg | Freq: Once | INTRAMUSCULAR | Status: AC
  Administered 2016-10-21: 10 mg via INTRAVENOUS
  Filled 2016-10-21: qty 2

## 2016-10-21 MED ORDER — DICYCLOMINE HCL 20 MG PO TABS: 20.0000 mg | ORAL_TABLET | Freq: Three times a day (TID) | ORAL | 0 refills | Status: DC | PRN

## 2016-10-21 MED ORDER — DICYCLOMINE HCL 10 MG PO CAPS
10.0000 mg | ORAL_CAPSULE | Freq: Once | ORAL | Status: AC
  Administered 2016-10-21: 10 mg via ORAL
  Filled 2016-10-21: qty 1

## 2016-10-21 MED ORDER — ONDANSETRON HCL 4 MG/2ML IJ SOLN
4.0000 mg | Freq: Once | INTRAMUSCULAR | Status: AC
  Administered 2016-10-21: 4 mg via INTRAVENOUS
  Filled 2016-10-21: qty 2

## 2016-10-21 MED ORDER — METOCLOPRAMIDE HCL 10 MG PO TABS: 10.0000 mg | ORAL_TABLET | Freq: Three times a day (TID) | ORAL | 0 refills | Status: DC | PRN

## 2016-10-21 NOTE — ED Provider Notes (Signed)
[facility] Emergency Department Provider Note   ____________________________________________   I have reviewed the triage vital signs and the nursing notes.   HISTORY  Chief Complaint Abdominal pain  History limited by: Not Limited   HPI Virginia Guerrero is a 36 y.o. female who presents to the emergency department today because of concerns for abdominal pain. She states that is located both the top and bottom part of her abdomen. Pain started this morning. She describes it as severe. She is roughly [redacted] weeks pregnant and states that she did notice some light spotting earlier today but no further spotting. She did endorse any further abnormal vaginal discharge. In addition to the pain the patient states that she has been having nausea and vomiting. She denies any previous nausea or vomiting during this pregnancy. Did have an ultrasound performed roughly 6 weeks ago which did show an intrauterine pregnancy.   Past Medical History:  Diagnosis Date  . Abortion, nontherapeutic    in a clinic  . Carpal tunnel syndrome   . Hx of chest pain   . Infections of genitourinary tract in pregnancy, unspecified as to episode of care(646.60)   . No pertinent past medical history   . Painful respiration    costochondral pain  . Syncope and collapse     Patient Active Problem List   Diagnosis Date Noted  . Anemia 06/20/2011  . Anxiety and depression 06/20/2011    Past Surgical History:  Procedure Laterality Date  . CESAREAN SECTION N/A 11/17/2012   Procedure: CESAREAN SECTION;  Surgeon: Lenoard Aden, MD;  Location: [facility];  Service: Obstetrics;  Laterality: N/A;  . INDUCED ABORTION     in a clinic    Prior to Admission medications   Not on File    Allergies Patient has no known allergies.  Family History  Problem Relation Age of Onset  . Arthritis Other   . Hypertension Father   . Cancer Paternal Uncle     pancreatic  . Hypertension Maternal  Grandfather   . Cancer Paternal Grandmother     breast    Social History Social History  Substance Use Topics  . Smoking status: Former Smoker    Types: Cigarettes  . Smokeless tobacco: Never Used  . Alcohol use No     Comment: not with pregnancy    Review of Systems Constitutional: No fever/chills Eyes: No visual changes. ENT: No sore throat. Cardiovascular: Denies chest pain. Respiratory: Denies shortness of breath. Gastrointestinal: Positive for abdominal pain, vomiting.  Genitourinary: Negative for dysuria. Musculoskeletal: Negative for back pain. Skin: Negative for rash. Neurological: Negative for headaches, focal weakness or numbness.  ____________________________________________   PHYSICAL EXAM:  VITAL SIGNS: ED Triage Vitals  Enc Vitals Group     BP 10/21/16 1102 112/74     Pulse Rate 10/21/16 1102 91     Resp 10/21/16 1102 20     Temp 10/21/16 1102 98.7 F (37.1 C)     Temp Source 10/21/16 1102 Oral     SpO2 10/21/16 1102 100 %     Weight 10/21/16 1103 146 lb (66.2 kg)     Height 10/21/16 1103  (1.626 m)     Head Circumference --      Peak Flow --      Pain Score 10/21/16 1102 10   Constitutional: Alert and oriented. Well appearing and in no distress. Eyes: Conjunctivae are normal. Normal extraocular movements. ENT   Head: Normocephalic and atraumatic.  Nose: No congestion/rhinnorhea.   Mouth/Throat: Mucous membranes are moist.   Neck: No stridor. Hematological/Lymphatic/Immunilogical: No cervical lymphadenopathy. Cardiovascular: Normal rate, regular rhythm.  No murmurs, rubs, or gallops.  Respiratory: Normal respiratory effort without tachypnea nor retractions. Breath sounds are clear and equal bilaterally. No wheezes/rales/rhonchi. Gastrointestinal: Soft and diffusely tender to palpation. No rebound. No guarding.  Genitourinary: Deferred Musculoskeletal: Normal range of motion in all extremities. No lower extremity  edema. Neurologic:  Normal speech and language. No gross focal neurologic deficits are appreciated.  Skin:  Skin is warm, dry and intact. No rash noted. Psychiatric: Mood and affect are normal. Speech and behavior are normal. Patient exhibits appropriate insight and judgment.  ____________________________________________    LABS (pertinent positives/negatives)  Labs Reviewed  HCG, QUANTITATIVE, PREGNANCY - Abnormal; Notable for the following:       Result Value   hCG, Beta Chain, Quant, S 39,521 (*)    All other components within normal limits  COMPREHENSIVE METABOLIC PANEL - Abnormal; Notable for the following:    Potassium 3.2 (*)    Glucose, Bld 110 (*)    ALT 11 (*)    All other components within normal limits  CBC - Abnormal; Notable for the following:    Hemoglobin 11.5 (*)    HCT 34.0 (*)    RDW 14.9 (*)    All other components within normal limits  LIPASE, BLOOD  URINALYSIS, COMPLETE (UACMP) WITH MICROSCOPIC  TYPE AND SCREEN     ____________________________________________   EKG  None  ____________________________________________    RADIOLOGY  None  ____________________________________________   PROCEDURES  Procedures  ____________________________________________   INITIAL IMPRESSION / ASSESSMENT AND PLAN / ED COURSE  Pertinent labs & imaging results that were available during my care of the patient were reviewed by me and considered in my medical decision making (see chart for details).  Patient presented to the emergency department today because of concerns for abdominal pain nausea and vomiting. Exam patient was somewhat diffusely tender. No focal tenderness. Patient denied a leukocytosis or fever. At this point I doubt significant intra-abdominal infection. Patient did feel improvement of pain after Bentyl. Additionally she was initially given Reglan however continued to have some somewhat nauseous and was given Zofran. She stated this helped as  well. Discussed return precautions with patient. Will give her OB/GYN follow-up.  ____________________________________________   FINAL CLINICAL IMPRESSION(S) / ED DIAGNOSES  Final diagnoses:  Nausea and vomiting, intractability of vomiting not specified, unspecified vomiting type  Abdominal pain, unspecified abdominal location     Note: This dictation was prepared with Dragon dictation. Any transcriptional errors that result from this process are unintentional     Phineas Semen, MD 10/21/16 1506

## 2016-10-21 NOTE — ED Notes (Signed)
Pt given warm blankets, assisted to lie down on bench in lobby.

## 2016-10-21 NOTE — ED Notes (Signed)
Pt states she is [redacted] weeks pregnant but states she has not had any prenatal care, states this AM she noticed some dark red spotting, states nausea and vomiting that developed this AM, pt awake and alert

## 2016-10-21 NOTE — ED Triage Notes (Signed)
Pt is 16 weeks preg, 3rd preg. Starting little bleeding this AM, no clots, states some discharge. Upper abd pain. Some nausea.

## 2016-10-21 NOTE — Discharge Instructions (Signed)
Please seek medical attention for any high fevers, chest pain, shortness of breath, change in behavior, persistent vomiting, bloody stool or any other new or concerning symptoms.  

## 2016-11-07 ENCOUNTER — Ambulatory Visit (INDEPENDENT_AMBULATORY_CARE_PROVIDER_SITE_OTHER): Payer: 59 | Admitting: Clinical

## 2016-11-07 ENCOUNTER — Ambulatory Visit (INDEPENDENT_AMBULATORY_CARE_PROVIDER_SITE_OTHER): Payer: Medicaid Other | Admitting: Family Medicine

## 2016-11-07 ENCOUNTER — Encounter: Payer: Self-pay | Admitting: Family Medicine

## 2016-11-07 VITALS — BP 121/81 | HR 89 | Wt 148.1 lb

## 2016-11-07 DIAGNOSIS — F32A Depression, unspecified: Secondary | ICD-10-CM

## 2016-11-07 DIAGNOSIS — F419 Anxiety disorder, unspecified: Secondary | ICD-10-CM

## 2016-11-07 DIAGNOSIS — F4323 Adjustment disorder with mixed anxiety and depressed mood: Secondary | ICD-10-CM | POA: Diagnosis not present

## 2016-11-07 DIAGNOSIS — K649 Unspecified hemorrhoids: Secondary | ICD-10-CM

## 2016-11-07 DIAGNOSIS — Z98891 History of uterine scar from previous surgery: Secondary | ICD-10-CM | POA: Insufficient documentation

## 2016-11-07 DIAGNOSIS — O34219 Maternal care for unspecified type scar from previous cesarean delivery: Secondary | ICD-10-CM

## 2016-11-07 DIAGNOSIS — Z3482 Encounter for supervision of other normal pregnancy, second trimester: Secondary | ICD-10-CM

## 2016-11-07 DIAGNOSIS — Z3490 Encounter for supervision of normal pregnancy, unspecified, unspecified trimester: Secondary | ICD-10-CM

## 2016-11-07 DIAGNOSIS — N3000 Acute cystitis without hematuria: Secondary | ICD-10-CM

## 2016-11-07 DIAGNOSIS — F329 Major depressive disorder, single episode, unspecified: Secondary | ICD-10-CM

## 2016-11-07 LAB — POCT URINALYSIS DIP (DEVICE)
BILIRUBIN URINE: NEGATIVE
GLUCOSE, UA: NEGATIVE mg/dL
Hgb urine dipstick: NEGATIVE
KETONES UR: NEGATIVE mg/dL
NITRITE: POSITIVE — AB
PROTEIN: NEGATIVE mg/dL
Specific Gravity, Urine: 1.02 (ref 1.005–1.030)
Urobilinogen, UA: 1 mg/dL (ref 0.0–1.0)
pH: 7 (ref 5.0–8.0)

## 2016-11-07 MED ORDER — HYDROCORTISONE 2.5 % RE CREA: 1.0000 "application " | TOPICAL_CREAM | Freq: Two times a day (BID) | RECTAL | 0 refills | Status: DC

## 2016-11-07 MED ORDER — CEPHALEXIN 500 MG PO CAPS: 500.0000 mg | ORAL_CAPSULE | Freq: Four times a day (QID) | ORAL | 0 refills | Status: DC

## 2016-11-07 NOTE — Progress Notes (Signed)
Here for initial prenatal visit. c/o pressure and contractions- states thinks she has a uti. Declines flu shot. Signed up for [facility] app. PHQ9 score elevated- offered option to see our behavioral health clinician and she would like to see her today if possible.

## 2016-11-07 NOTE — Progress Notes (Addendum)
Subjective:    Virginia Guerrero is a [id number] [redacted]w[redacted]d being seen today for her first obstetrical visit.  Her obstetrical history is significant for previous C-section.  Pregnancy history fully reviewed.  Patient reports found ligament pain on right, hemorrhoids, occasional BH contractions with full bladder, and anxiety with little social support.  Vitals:   11/07/16 1511  BP: 121/81  Pulse: 89  Weight: 148 lb 1.6 oz (67.2 kg)    HISTORY: OB History  Gravida Para Term Preterm AB Living  4 2 2  0 1 2  SAB TAB Ectopic Multiple Live Births  0 1 0 0 2    # Outcome Date GA Lbr Len/2nd Weight Sex Delivery Anes PTL Lv  4 Current           3 Term 11/17/12 [redacted]w[redacted]d 07:37 / 03:06 6 lb 11.8 oz (3.055 kg) F CS-LTranv EPI  LIV  2 Term 08/01/05 [redacted]w[redacted]d   F Vag-Spont EPI  LIV     Birth Comments: no complications,   1 TAB 2002             Past Medical History:  Diagnosis Date  . Abortion, nontherapeutic    in a clinic  . Carpal tunnel syndrome   . Hx of chest pain   . Infections of genitourinary tract in pregnancy, unspecified as to episode of care(646.60)   . No pertinent past medical history   . Painful respiration    costochondral pain  . Syncope and collapse    Past Surgical History:  Procedure Laterality Date  . CESAREAN SECTION N/A 11/17/2012   Procedure: CESAREAN SECTION;  Surgeon: Lenoard Aden, MD;  Location: [facility];  Service: Obstetrics;  Laterality: N/A;  . INDUCED ABORTION     in a clinic   Family History  Problem Relation Age of Onset  . Arthritis Other   . Lung cancer Father   . Cancer Father        nasopharygeal  . Cancer Paternal Uncle        pancreatic  . Hypertension Maternal Grandfather   . Cancer Paternal Grandmother        breast  . Hypertension Mother   . Cancer Maternal Grandmother        lymphoma     Exam    Uterus:   U-3  Pelvic Exam:    Perineum: Normal Perineum   Vulva: Bartholin's, Urethra, Skene's normal   Vagina:  normal mucosa, normal  discharge   Cervix: multiparous appearance and no bleeding following Pap   Adnexa: normal adnexa   Bony Pelvis: average  System: Breast:  normal appearance, no masses or tenderness   Skin: normal coloration and turgor, no rashes    Neurologic: oriented   Extremities: normal strength, tone, and muscle mass   HEENT extra ocular movement intact and sclera clear, anicteric   Mouth/Teeth mucous membranes moist, pharynx normal without lesions   Neck supple   Cardiovascular: regular rate and rhythm, no murmurs or gallops   Respiratory:  appears well, vitals normal, no respiratory distress, acyanotic, normal RR, ear and throat exam is normal, neck free of mass or lymphadenopathy, chest clear, no wheezing, crepitations, rhonchi, normal symmetric air entry   Abdomen: soft, non-tender; bowel sounds normal; no masses,  no organomegaly   Urinalysis    Component Value Date/Time   COLORURINE YELLOW 09/11/2016 1318   APPEARANCEUR CLOUDY (A) 09/11/2016 1318   LABSPEC 1.020 11/07/2016 1610   PHURINE 7.0 11/07/2016 1610   GLUCOSEU NEGATIVE  11/07/2016 1610   HGBUR NEGATIVE 11/07/2016 1610   BILIRUBINUR NEGATIVE 11/07/2016 1610   KETONESUR NEGATIVE 11/07/2016 1610   PROTEINUR NEGATIVE 11/07/2016 1610   UROBILINOGEN 1.0 11/07/2016 1610   NITRITE POSITIVE (A) 11/07/2016 1610   LEUKOCYTESUR SMALL (A) 11/07/2016 1610       Assessment/Plan:    Pregnancy: [id number]  1. Encounter for supervision of low-risk pregnancy, antepartum New OB labs - Obstetric Panel, Including HIV - Culture, OB Urine - Hemoglobinopathy Evaluation - US MFM OB COMP + 14 WK; Future - AFP, Quad Screen - IGP, Aptima HPV, rfx 16/18,45  2. Previous cesarean delivery affecting pregnancy, antepartum Desires TOLAC--information given, will need consent signed later on  3. Hemorrhoids, unspecified hemorrhoid type  - hydrocortisone (ANUSOL-HC) 2.5 % rectal cream; Place 1 application rectally 2 (two) times daily.  Dispense: 30 g;  Refill: 0  4. Anxiety and Depression Amb ref to integrated behavioral health Mindfulness training.  5. Presumed UTI Keflex 500 mg qid  Return in 4 weeks (on 12/05/2016).  Reva Boresanya S Rowynn Mcweeney 11/07/2016

## 2016-11-07 NOTE — Patient Instructions (Signed)
 Second Trimester of Pregnancy The second trimester is from week 14 through week 27 (months 4 through 6). The second trimester is often a time when you feel your best. Your body has adjusted to being pregnant, and you begin to feel better physically. Usually, morning sickness has lessened or quit completely, you may have more energy, and you may have an increase in appetite. The second trimester is also a time when the fetus is growing rapidly. At the end of the sixth month, the fetus is about 9 inches long and weighs about 1 pounds. You will likely begin to feel the baby move (quickening) between 16 and 20 weeks of pregnancy. Body changes during your second trimester Your body continues to go through many changes during your second trimester. The changes vary from woman to woman.  Your weight will continue to increase. You will notice your lower abdomen bulging out.  You may begin to get stretch marks on your hips, abdomen, and breasts.  You may develop headaches that can be relieved by medicines. The medicines should be approved by your health care provider.  You may urinate more often because the fetus is pressing on your bladder.  You may develop or continue to have heartburn as a result of your pregnancy.  You may develop constipation because certain hormones are causing the muscles that push waste through your intestines to slow down.  You may develop hemorrhoids or swollen, bulging veins (varicose veins).  You may have back pain. This is caused by: ? Weight gain. ? Pregnancy hormones that are relaxing the joints in your pelvis. ? A shift in weight and the muscles that support your balance.  Your breasts will continue to grow and they will continue to become tender.  Your gums may bleed and may be sensitive to brushing and flossing.  Dark spots or blotches (chloasma, mask of pregnancy) may develop on your face. This will likely fade after the baby is born.  A dark line from  your belly button to the pubic area (linea nigra) may appear. This will likely fade after the baby is born.  You may have changes in your hair. These can include thickening of your hair, rapid growth, and changes in texture. Some women also have hair loss during or after pregnancy, or hair that feels dry or thin. Your hair will most likely return to normal after your baby is born.  What to expect at prenatal visits During a routine prenatal visit:  You will be weighed to make sure you and the fetus are growing normally.  Your blood pressure will be taken.  Your abdomen will be measured to track your baby's growth.  The fetal heartbeat will be listened to.  Any test results from the previous visit will be discussed.  Your health care provider may ask you:  How you are feeling.  If you are feeling the baby move.  If you have had any abnormal symptoms, such as leaking fluid, bleeding, severe headaches, or abdominal cramping.  If you are using any tobacco products, including cigarettes, chewing tobacco, and electronic cigarettes.  If you have any questions.  Other tests that may be performed during your second trimester include:  Blood tests that check for: ? Low iron levels (anemia). ? High blood sugar that affects pregnant women (gestational diabetes) between 24 and 28 weeks. ? Rh antibodies. This is to check for a protein on red blood cells (Rh factor).  Urine tests to check for infections, diabetes,   or protein in the urine.  An ultrasound to confirm the proper growth and development of the baby.  An amniocentesis to check for possible genetic problems.  Fetal screens for spina bifida and Down syndrome.  HIV (human immunodeficiency virus) testing. Routine prenatal testing includes screening for HIV, unless you choose not to have this test.  Follow these instructions at home: Medicines  Follow your health care provider's instructions regarding medicine use. Specific  medicines may be either safe or unsafe to take during pregnancy.  Take a prenatal vitamin that contains at least 600 micrograms (mcg) of folic acid.  If you develop constipation, try taking a stool softener if your health care provider approves. Eating and drinking  Eat a balanced diet that includes fresh fruits and vegetables, whole grains, good sources of protein such as meat, eggs, or tofu, and low-fat dairy. Your health care provider will help you determine the amount of weight gain that is right for you.  Avoid raw meat and uncooked cheese. These carry germs that can cause birth defects in the baby.  If you have low calcium intake from food, talk to your health care provider about whether you should take a daily calcium supplement.  Limit foods that are high in fat and processed sugars, such as fried and sweet foods.  To prevent constipation: ? Drink enough fluid to keep your urine clear or pale yellow. ? Eat foods that are high in fiber, such as fresh fruits and vegetables, whole grains, and beans. Activity  Exercise only as directed by your health care provider. Most women can continue their usual exercise routine during pregnancy. Try to exercise for 30 minutes at least 5 days a week. Stop exercising if you experience uterine contractions.  Avoid heavy lifting, wear low heel shoes, and practice good posture.  A sexual relationship may be continued unless your health care provider directs you otherwise. Relieving pain and discomfort  Wear a good support bra to prevent discomfort from breast tenderness.  Take warm sitz baths to soothe any pain or discomfort caused by hemorrhoids. Use hemorrhoid cream if your health care provider approves.  Rest with your legs elevated if you have leg cramps or low back pain.  If you develop varicose veins, wear support hose. Elevate your feet for 15 minutes, 3-4 times a day. Limit salt in your diet. Prenatal Care  Write down your questions.  Take them to your prenatal visits.  Keep all your prenatal visits as told by your health care provider. This is important. Safety  Wear your seat belt at all times when driving.  Make a list of emergency phone numbers, including numbers for family, friends, the hospital, and police and fire departments. General instructions  Ask your health care provider for a referral to a local prenatal education class. Begin classes no later than the beginning of month 6 of your pregnancy.  Ask for help if you have counseling or nutritional needs during pregnancy. Your health care provider can offer advice or refer you to specialists for help with various needs.  Do not use hot tubs, steam rooms, or saunas.  Do not douche or use tampons or scented sanitary pads.  Do not cross your legs for long periods of time.  Avoid cat litter boxes and soil used by cats. These carry germs that can cause birth defects in the baby and possibly loss of the fetus by miscarriage or stillbirth.  Avoid all smoking, herbs, alcohol, and unprescribed drugs. Chemicals in these products   can affect the formation and growth of the baby.  Do not use any products that contain nicotine or tobacco, such as cigarettes and e-cigarettes. If you need help quitting, ask your health care provider.  Visit your dentist if you have not gone yet during your pregnancy. Use a soft toothbrush to brush your teeth and be gentle when you floss. Contact a health care provider if:  You have dizziness.  You have mild pelvic cramps, pelvic pressure, or nagging pain in the abdominal area.  You have persistent nausea, vomiting, or diarrhea.  You have a bad smelling vaginal discharge.  You have pain when you urinate. Get help right away if:  You have a fever.  You are leaking fluid from your vagina.  You have spotting or bleeding from your vagina.  You have severe abdominal cramping or pain.  You have rapid weight gain or weight  loss.  You have shortness of breath with chest pain.  You notice sudden or extreme swelling of your face, hands, ankles, feet, or legs.  You have not felt your baby move in over an hour.  You have severe headaches that do not go away when you take medicine.  You have vision changes. Summary  The second trimester is from week 14 through week 27 (months 4 through 6). It is also a time when the fetus is growing rapidly.  Your body goes through many changes during pregnancy. The changes vary from woman to woman.  Avoid all smoking, herbs, alcohol, and unprescribed drugs. These chemicals affect the formation and growth your baby.  Do not use any tobacco products, such as cigarettes, chewing tobacco, and e-cigarettes. If you need help quitting, ask your health care provider.  Contact your health care provider if you have any questions. Keep all prenatal visits as told by your health care provider. This is important. This information is not intended to replace advice given to you by your health care provider. Make sure you discuss any questions you have with your health care provider. Document Released: 06/11/2001 Document Revised: 11/23/2015 Document Reviewed: 08/18/2012 [employer] Interactive Patient Education  2017 [employer]   Breastfeeding Deciding to breastfeed is one of the best choices you can make for you and your baby. A change in hormones during pregnancy causes your breast tissue to grow and increases the number and size of your milk ducts. These hormones also allow proteins, sugars, and fats from your blood supply to make breast milk in your milk-producing glands. Hormones prevent breast milk from being released before your baby is born as well as prompt milk flow after birth. Once breastfeeding has begun, thoughts of your baby, as well as his or her sucking or crying, can stimulate the release of milk from your milk-producing glands. Benefits of breastfeeding For Your  Baby  Your first milk (colostrum) helps your baby's digestive system function better.  There are antibodies in your milk that help your baby fight off infections.  Your baby has a lower incidence of asthma, allergies, and sudden infant death syndrome.  The nutrients in breast milk are better for your baby than infant formulas and are designed uniquely for your baby's needs.  Breast milk improves your baby's brain development.  Your baby is less likely to develop other conditions, such as childhood obesity, asthma, or type 2 diabetes mellitus.  For You  Breastfeeding helps to create a very special bond between you and your baby.  Breastfeeding is convenient. Breast milk is always available at   the correct temperature and costs nothing.  Breastfeeding helps to burn calories and helps you lose the weight gained during pregnancy.  Breastfeeding makes your uterus contract to its prepregnancy size faster and slows bleeding (lochia) after you give birth.  Breastfeeding helps to lower your risk of developing type 2 diabetes mellitus, osteoporosis, and breast or ovarian cancer later in life.  Signs that your baby is hungry Early Signs of Hunger  Increased alertness or activity.  Stretching.  Movement of the head from side to side.  Movement of the head and opening of the mouth when the corner of the mouth or cheek is stroked (rooting).  Increased sucking sounds, smacking lips, cooing, sighing, or squeaking.  Hand-to-mouth movements.  Increased sucking of fingers or hands.  Late Signs of Hunger  Fussing.  Intermittent crying.  Extreme Signs of Hunger Signs of extreme hunger will require calming and consoling before your baby will be able to breastfeed successfully. Do not wait for the following signs of extreme hunger to occur before you initiate breastfeeding:  Restlessness.  A loud, strong cry.  Screaming.  Breastfeeding basics Breastfeeding Initiation  Find a  comfortable place to sit or lie down, with your neck and back well supported.  Place a pillow or rolled up blanket under your baby to bring him or her to the level of your breast (if you are seated). Nursing pillows are specially designed to help support your arms and your baby while you breastfeed.  Make sure that your baby's abdomen is facing your abdomen.  Gently massage your breast. With your fingertips, massage from your chest wall toward your nipple in a circular motion. This encourages milk flow. You may need to continue this action during the feeding if your milk flows slowly.  Support your breast with 4 fingers underneath and your thumb above your nipple. Make sure your fingers are well away from your nipple and your baby's mouth.  Stroke your baby's lips gently with your finger or nipple.  When your baby's mouth is open wide enough, quickly bring your baby to your breast, placing your entire nipple and as much of the colored area around your nipple (areola) as possible into your baby's mouth. ? More areola should be visible above your baby's upper lip than below the lower lip. ? Your baby's tongue should be between his or her lower gum and your breast.  Ensure that your baby's mouth is correctly positioned around your nipple (latched). Your baby's lips should create a seal on your breast and be turned out (everted).  It is common for your baby to suck about 2-3 minutes in order to start the flow of breast milk.  Latching Teaching your baby how to latch on to your breast properly is very important. An improper latch can cause nipple pain and decreased milk supply for you and poor weight gain in your baby. Also, if your baby is not latched onto your nipple properly, he or she may swallow some air during feeding. This can make your baby fussy. Burping your baby when you switch breasts during the feeding can help to get rid of the air. However, teaching your baby to latch on properly is  still the best way to prevent fussiness from swallowing air while breastfeeding. Signs that your baby has successfully latched on to your nipple:  Silent tugging or silent sucking, without causing you pain.  Swallowing heard between every 3-4 sucks.  Muscle movement above and in front of his or her   ears while sucking.  Signs that your baby has not successfully latched on to nipple:  Sucking sounds or smacking sounds from your baby while breastfeeding.  Nipple pain.  If you think your baby has not latched on correctly, slip your finger into the corner of your baby's mouth to break the suction and place it between your baby's gums. Attempt breastfeeding initiation again. Signs of Successful Breastfeeding Signs from your baby:  A gradual decrease in the number of sucks or complete cessation of sucking.  Falling asleep.  Relaxation of his or her body.  Retention of a small amount of milk in his or her mouth.  Letting go of your breast by himself or herself.  Signs from you:  Breasts that have increased in firmness, weight, and size 1-3 hours after feeding.  Breasts that are softer immediately after breastfeeding.  Increased milk volume, as well as a change in milk consistency and color by the fifth day of breastfeeding.  Nipples that are not sore, cracked, or bleeding.  Signs That Your Baby is Getting Enough Milk  Wetting at least 1-2 diapers during the first 24 hours after birth.  Wetting at least 5-6 diapers every 24 hours for the first week after birth. The urine should be clear or pale yellow by 5 days after birth.  Wetting 6-8 diapers every 24 hours as your baby continues to grow and develop.  At least 3 stools in a 24-hour period by age 5 days. The stool should be soft and yellow.  At least 3 stools in a 24-hour period by age 7 days. The stool should be seedy and yellow.  No loss of weight greater than 10% of birth weight during the first 3 days of age.  Average  weight gain of 4-7 ounces (113-198 g) per week after age 4 days.  Consistent daily weight gain by age 5 days, without weight loss after the age of 2 weeks.  After a feeding, your baby may spit up a small amount. This is common. Breastfeeding frequency and duration Frequent feeding will help you make more milk and can prevent sore nipples and breast engorgement. Breastfeed when you feel the need to reduce the fullness of your breasts or when your baby shows signs of hunger. This is called "breastfeeding on demand." Avoid introducing a pacifier to your baby while you are working to establish breastfeeding (the first 4-6 weeks after your baby is born). After this time you may choose to use a pacifier. Research has shown that pacifier use during the first year of a baby's life decreases the risk of sudden infant death syndrome (SIDS). Allow your baby to feed on each breast as long as he or she wants. Breastfeed until your baby is finished feeding. When your baby unlatches or falls asleep while feeding from the first breast, offer the second breast. Because newborns are often sleepy in the first few weeks of life, you may need to awaken your baby to get him or her to feed. Breastfeeding times will vary from baby to baby. However, the following rules can serve as a guide to help you ensure that your baby is properly fed:  Newborns (babies 4 weeks of age or younger) may breastfeed every 1-3 hours.  Newborns should not go longer than 3 hours during the day or 5 hours during the night without breastfeeding.  You should breastfeed your baby a minimum of 8 times in a 24-hour period until you begin to introduce solid foods to your   baby at around 6 months of age.  Breast milk pumping Pumping and storing breast milk allows you to ensure that your baby is exclusively fed your breast milk, even at times when you are unable to breastfeed. This is especially important if you are going back to work while you are still  breastfeeding or when you are not able to be present during feedings. Your lactation consultant can give you guidelines on how long it is safe to store breast milk. A breast pump is a machine that allows you to pump milk from your breast into a sterile bottle. The pumped breast milk can then be stored in a refrigerator or freezer. Some breast pumps are operated by hand, while others use electricity. Ask your lactation consultant which type will work best for you. Breast pumps can be purchased, but some hospitals and breastfeeding support groups lease breast pumps on a monthly basis. A lactation consultant can teach you how to hand express breast milk, if you prefer not to use a pump. Caring for your breasts while you breastfeed Nipples can become dry, cracked, and sore while breastfeeding. The following recommendations can help keep your breasts moisturized and healthy:  Avoid using soap on your nipples.  Wear a supportive bra. Although not required, special nursing bras and tank tops are designed to allow access to your breasts for breastfeeding without taking off your entire bra or top. Avoid wearing underwire-style bras or extremely tight bras.  Air dry your nipples for 3-4minutes after each feeding.  Use only cotton bra pads to absorb leaked breast milk. Leaking of breast milk between feedings is normal.  Use lanolin on your nipples after breastfeeding. Lanolin helps to maintain your skin's normal moisture barrier. If you use pure lanolin, you do not need to wash it off before feeding your baby again. Pure lanolin is not toxic to your baby. You may also hand express a few drops of breast milk and gently massage that milk into your nipples and allow the milk to air dry.  In the first few weeks after giving birth, some women experience extremely full breasts (engorgement). Engorgement can make your breasts feel heavy, warm, and tender to the touch. Engorgement peaks within 3-5 days after you give  birth. The following recommendations can help ease engorgement:  Completely empty your breasts while breastfeeding or pumping. You may want to start by applying warm, moist heat (in the shower or with warm water-soaked hand towels) just before feeding or pumping. This increases circulation and helps the milk flow. If your baby does not completely empty your breasts while breastfeeding, pump any extra milk after he or she is finished.  Wear a snug bra (nursing or regular) or tank top for 1-2 days to signal your body to slightly decrease milk production.  Apply ice packs to your breasts, unless this is too uncomfortable for you.  Make sure that your baby is latched on and positioned properly while breastfeeding.  If engorgement persists after 48 hours of following these recommendations, contact your health care provider or a lactation consultant. Overall health care recommendations while breastfeeding  Eat healthy foods. Alternate between meals and snacks, eating 3 of each per day. Because what you eat affects your breast milk, some of the foods may make your baby more irritable than usual. Avoid eating these foods if you are sure that they are negatively affecting your baby.  Drink milk, fruit juice, and water to satisfy your thirst (about 10 glasses a day).    your prenatal vitamins to prevent fatigue, stress, and anemia.  Continue breast self-awareness checks.  Avoid chewing and smoking tobacco. Chemicals from cigarettes that pass into breast milk and exposure to secondhand smoke may harm your baby.  Avoid alcohol and drug use, including marijuana. Some medicines that may be harmful to your baby can pass through breast milk. It is important to ask your health care provider before taking any medicine, including all over-the-counter and prescription medicine as well as vitamin and herbal supplements. It is possible to become pregnant while breastfeeding. If birth control is  desired, ask your health care provider about options that will be safe for your baby. Contact a health care provider if:  You feel like you want to stop breastfeeding or have become frustrated with breastfeeding.  You have painful breasts or nipples.  Your nipples are cracked or bleeding.  Your breasts are red, tender, or warm.  You have a swollen area on either breast.  You have a fever or chills.  You have nausea or vomiting.  You have drainage other than breast milk from your nipples.  Your breasts do not become full before feedings by the fifth day after you give birth.  You feel sad and depressed.  Your baby is too sleepy to eat well.  Your baby is having trouble sleeping.  Your baby is wetting less than 3 diapers in a 24-hour period.  Your baby has less than 3 stools in a 24-hour period.  Your baby's skin or the white part of his or her eyes becomes yellow.  Your baby is not gaining weight by 17 days of age. Get help right away if:  Your baby is overly tired (lethargic) and does not want to wake up and feed.  Your baby develops an unexplained fever. This information is not intended to replace advice given to you by your health care provider. Make sure you discuss any questions you have with your health care provider. Document Released: 06/17/2005 Document Revised: 11/29/2015 Document Reviewed: 12/09/2012 [employer] Interactive Patient Education  2017 [employer]  Vaginal Birth After Cesarean Delivery Vaginal birth after cesarean delivery (VBAC) is giving birth vaginally after previously delivering a baby by a cesarean. In the past, if a woman had a cesarean delivery, all births afterward would be done by cesarean delivery. This is no longer true. It can be safe for the mother to try a vaginal delivery after having a cesarean delivery. It is important to discuss VBAC with your health care provider early in the pregnancy so you can understand the risks, benefits, and  options. It will give you time to decide what is best in your particular case. The final decision about whether to have a VBAC or repeat cesarean delivery should be between you and your health care provider. Any changes in your health or your baby's health during your pregnancy may make it necessary to change your initial decision about VBAC. Women who plan to have a VBAC should check with their health care provider to be sure that:  The previous cesarean delivery was done with a low transverse uterine cut (incision) (not a vertical classical incision).  The birth canal is big enough for the baby.  There were no other operations on the uterus.  An electronic fetal monitor (EFM) will be on at all times during labor.  An operating room will be available and ready in case an emergency cesarean delivery is needed.  A health care provider and surgical nursing staff will  be available at all times during labor to be ready to do an emergency delivery cesarean if necessary.  An anesthesiologist will be present in case an emergency cesarean delivery is needed.  The nursery is prepared and has adequate personnel and necessary equipment available to care for the baby in case of an emergency cesarean delivery. Benefits of VBAC  Shorter stay in the hospital.  Avoidance of risks associated with cesarean delivery, such as:  Surgical complications, such as opening of the incision or hernia in the incision.  Injury to other organs.  Fever. This can occur if an infection develops after surgery. It can also occur as a reaction to the medicine given to make you numb during the surgery.  Less blood loss and need for blood transfusions.  Lower risk of blood clots and infection.  Shorter recovery.  Decreased risk for having to remove the uterus (hysterectomy).  Decreased risk for the placenta to completely or partially cover the opening of the uterus (placenta previa) with a future  pregnancy.  Decrease risk in future labor and delivery. Risks of a VBAC  Tearing (rupture) of the uterus. This is occurs in less than 1% of VBACs. The risk of this happening is higher if:  Steps are taken to begin the labor process (induce labor) or stimulate or strengthen contractions (augment labor).  Medicine is used to soften (ripen) the cervix.  Having to remove the uterus (hysterectomy) if it ruptures. VBAC should not be done if:  The previous cesarean delivery was done with a vertical (classical) or T-shaped incision or you do not know what kind of incision was made.  You had a ruptured uterus.  You have had certain types of surgery on your uterus, such as removal of uterine fibroids. Ask your health care provider about other types of surgeries that prevent you from having a VBAC.  You have certain medical or childbirth (obstetrical) problems.  There are problems with the baby.  You have had two previous cesarean deliveries and no vaginal deliveries. Other facts to know about VBAC:  It is safe to have an epidural anesthetic with VBAC.  It is safe to turn the baby from a breech position (attempt an external cephalic version).  It is safe to try a VBAC with twins.  VBAC may not be successful if your baby weights 8.8 lb (4 kg) or more. However, weight predictions are not always accurate and should not be used alone to decide if VBAC is right for you.  There is an increased failure rate if the time between the cesarean delivery and VBAC is less than 19 months.  Your health care provider may advise against a VBAC if you have preeclampsia (high blood pressure, protein in the urine, and swelling of face and extremities).  VBAC is often successful if you previously gave birth vaginally.  VBAC is often successful when the labor starts spontaneously before the due date.  Delivering a baby through a VBAC is similar to having a normal spontaneous vaginal delivery. This  information is not intended to replace advice given to you by your health care provider. Make sure you discuss any questions you have with your health care provider. Document Released: 12/08/2006 Document Revised: 11/23/2015 Document Reviewed: 01/14/2013 Elsevier Interactive Patient Education  2017 [employer]

## 2016-11-07 NOTE — BH Specialist Note (Signed)
Integrated Behavioral Health Initial Visit  MRN: [id number] Name: Jacqualine MauCatrice Honaker   Session Start time: 4:15 Session End time: 5:15 Total time: 1 hour  Type of Service: Integrated Behavioral Health- Individual/Family Interpretor:No. Interpretor Name and Language: n/a   Warm Hand Off Completed.       SUBJECTIVE: Odyssey Delford FieldWright is a 36 y.o. female accompanied by patient. Patient was referred by Dr Shawnie PonsPratt for depression, anxiety. Patient reports the following symptoms/concerns: Pt states primary symptoms are difficulty with sleep, concentration and worry Duration of problem: Current pregnancy; Severity of problem: moderate  OBJECTIVE: Mood: Anxious and Affect: Tearful Risk of harm to self or others: No plan to harm self or others   LIFE CONTEXT: Family and Social: Lives with 3yo daughter; also has 11yo daughter. Friends and family supportive, except for pt mother School/Work: Just started new job Self-Care: meditation, physically active Life Changes: Current pregnancy, relationship difficulties  GOALS ADDRESSED: Patient will reduce symptoms of: anxiety and depression and increase knowledge and/or ability of: coping skills and also: Increase healthy adjustment to current life circumstances   INTERVENTIONS: Solution-Focused Strategies and Psychoeducation and/or Health Education  Standardized Assessments completed: GAD-7 and PHQ 2&9 with C-SSRS  ASSESSMENT: Patient currently experiencing Adjustment disorder with mixed anxious and depressed mood. Patient may benefit from psychoeducation and brief therapeutic interventions regarding coping with symptoms of anxiety and depression .  PLAN: 1. Follow up with behavioral health clinician on : One month 2. Behavioral recommendations:  -Use sleep sound channel on TV tonight(sound of thunder); continue nightly, if sleep improves -Practice Worry Hour technique to prioritize daily stressors -Read educational materials regarding coping  with symptoms of anxiety with panic attack and depression 3. Referral(s): [facility]  Valetta CloseJamie C ParcoalMcMannes, [place]CSWA   Depression screen [facility] 11/07/2016  Decreased Interest 0  Down, Depressed, Hopeless 1  PHQ - 2 Score 1  Altered sleeping 3  Tired, decreased energy 1  Change in appetite 1  Feeling bad or failure about yourself  2  Trouble concentrating 3  Moving slowly or fidgety/restless 0  Suicidal thoughts 0  PHQ-9 Score 11    GAD 7 : Generalized Anxiety Score 11/07/2016  Nervous, Anxious, on Edge 2  Control/stop worrying 2  Worry too much - different things 3  Trouble relaxing 1  Restless 0  Easily annoyed or irritable 1  Afraid - awful might happen 1  Total GAD 7 Score 10

## 2016-11-07 NOTE — Addendum Note (Signed)
Addended by: Reva BoresPRATT, Linna Thebeau S on: 11/07/2016 04:15 PM   Modules accepted: Orders

## 2016-11-08 LAB — OBSTETRIC PANEL, INCLUDING HIV
Antibody Screen: NEGATIVE
BASOS ABS: 0 10*3/uL (ref 0.0–0.2)
Basos: 0 %
EOS (ABSOLUTE): 0.1 10*3/uL (ref 0.0–0.4)
Eos: 1 %
HEP B S AG: NEGATIVE
HIV SCREEN 4TH GENERATION: NONREACTIVE
Hematocrit: 33 % — ABNORMAL LOW (ref 34.0–46.6)
Hemoglobin: 10.6 g/dL — ABNORMAL LOW (ref 11.1–15.9)
IMMATURE GRANULOCYTES: 0 %
Immature Grans (Abs): 0 10*3/uL (ref 0.0–0.1)
Lymphocytes Absolute: 2.2 10*3/uL (ref 0.7–3.1)
Lymphs: 35 %
MCH: 26.1 pg — AB (ref 26.6–33.0)
MCHC: 32.1 g/dL (ref 31.5–35.7)
MCV: 81 fL (ref 79–97)
MONOCYTES: 7 %
Monocytes Absolute: 0.5 10*3/uL (ref 0.1–0.9)
NEUTROS ABS: 3.5 10*3/uL (ref 1.4–7.0)
NEUTROS PCT: 57 %
PLATELETS: 313 10*3/uL (ref 150–379)
RBC: 4.06 x10E6/uL (ref 3.77–5.28)
RDW: 15.2 % (ref 12.3–15.4)
RPR: NONREACTIVE
Rh Factor: POSITIVE
Rubella Antibodies, IGG: 6.85 index (ref >0.99)
WBC: 6.4 10*3/uL (ref 3.4–10.8)

## 2016-11-08 LAB — HEMOGLOBINOPATHY EVALUATION
FERRITIN: 18 ng/mL (ref 15–150)
HGB C: 0 %
HGB F QUANT: 0 % (ref 0.0–2.0)
HGB SOLUBILITY: NEGATIVE
Hgb A2 Quant: 2.3 % (ref 1.8–3.2)
Hgb A: 97.7 % (ref 96.4–98.8)
Hgb S: 0 %
Hgb Variant: 0 %

## 2016-11-11 LAB — IGP, APTIMA HPV, RFX 16/18,45
HPV APTIMA: NEGATIVE
PAP SMEAR COMMENT: 0

## 2016-11-11 LAB — URINE CULTURE, OB REFLEX

## 2016-11-11 LAB — CULTURE, OB URINE

## 2016-11-12 LAB — AFP, QUAD SCREEN
DIA Mom Value: 0.94
DIA VALUE (EIA): 157.2 pg/mL
DSR (By Age)    1 IN: 367
DSR (Second Trimester) 1 IN: 6771
Gestational Age: 17.7 WEEKS
MATERNAL AGE AT EDD: 34 a
MSAFP Mom: 1.55
MSAFP: 70.6 ng/mL
MSHCG MOM: 1.01
MSHCG: 28753 m[IU]/mL
Osb Risk: 4785
T18 (By Age): 1:1430 {titer}
TEST RESULTS AFP: NEGATIVE
Weight: 148 [lb_av]
uE3 Mom: 1.19
uE3 Value: 1.4 ng/mL

## 2016-11-27 ENCOUNTER — Telehealth: Payer: Self-pay | Admitting: Family Medicine

## 2016-11-27 ENCOUNTER — Ambulatory Visit (HOSPITAL_COMMUNITY)
Admission: RE | Admit: 2016-11-27 | Discharge: 2016-11-27 | Disposition: A | Discharge status: Home / Self Care | Payer: Medicaid Other | Source: Ambulatory Visit | Attending: Family Medicine | Admitting: Family Medicine

## 2016-11-27 DIAGNOSIS — Z3689 Encounter for other specified antenatal screening: Secondary | ICD-10-CM | POA: Diagnosis not present

## 2016-11-27 DIAGNOSIS — Z3A20 20 weeks gestation of pregnancy: Secondary | ICD-10-CM | POA: Diagnosis not present

## 2016-11-27 DIAGNOSIS — Z3490 Encounter for supervision of normal pregnancy, unspecified, unspecified trimester: Secondary | ICD-10-CM | POA: Diagnosis not present

## 2016-11-27 DIAGNOSIS — O283 Abnormal ultrasonic finding on antenatal screening of mother: Secondary | ICD-10-CM

## 2016-11-27 NOTE — Addendum Note (Signed)
Encounter addended by: Levonne HubertStalter, Janey Petron M on: 11/27/2016  9:46 AM<BR>    Actions taken: Imaging Exam ended

## 2016-11-28 NOTE — Telephone Encounter (Signed)
Per Dr. Shawnie PonsPratt- needs ultrasound in 6 weeks to follow up growth and UTD. - US shows mild dilation of the tubes that drain the kidneys. The follow up us will help us decide if there is a significant problem.

## 2016-11-28 NOTE — Telephone Encounter (Signed)
Virginia Guerrero called back and I explained to her results and reccommedations- I gave her the ultrasound appt and next ob appt and that she can discuss in detail with provider next week at her ob visit. She voices understanding.

## 2016-11-28 NOTE — Telephone Encounter (Signed)
Called both home and mobile numbers and left messages for patient that we are calling with some information and an appointment- please call our office.

## 2016-12-05 ENCOUNTER — Ambulatory Visit (INDEPENDENT_AMBULATORY_CARE_PROVIDER_SITE_OTHER): Payer: Medicaid Other | Admitting: Medical

## 2016-12-05 ENCOUNTER — Encounter: Payer: Self-pay | Admitting: Medical

## 2016-12-05 ENCOUNTER — Encounter: Payer: Self-pay | Admitting: Advanced Practice Midwife

## 2016-12-05 VITALS — BP 113/71 | HR 78 | Wt 156.2 lb

## 2016-12-05 DIAGNOSIS — Z3482 Encounter for supervision of other normal pregnancy, second trimester: Secondary | ICD-10-CM

## 2016-12-05 DIAGNOSIS — Z3492 Encounter for supervision of normal pregnancy, unspecified, second trimester: Secondary | ICD-10-CM | POA: Diagnosis not present

## 2016-12-05 DIAGNOSIS — O34219 Maternal care for unspecified type scar from previous cesarean delivery: Secondary | ICD-10-CM

## 2016-12-05 DIAGNOSIS — F32A Depression, unspecified: Secondary | ICD-10-CM

## 2016-12-05 DIAGNOSIS — Z3490 Encounter for supervision of normal pregnancy, unspecified, unspecified trimester: Secondary | ICD-10-CM

## 2016-12-05 DIAGNOSIS — F329 Major depressive disorder, single episode, unspecified: Secondary | ICD-10-CM

## 2016-12-05 DIAGNOSIS — F419 Anxiety disorder, unspecified: Secondary | ICD-10-CM

## 2016-12-05 NOTE — Progress Notes (Signed)
   PRENATAL VISIT NOTE  Subjective:  Virginia Guerrero is a 36 y.o. [id number] at 3237w5d being seen today for ongoing prenatal care.  She is currently monitored for the following issues for this low-risk pregnancy and has Anemia; Anxiety and depression; Supervision of low-risk pregnancy; and Previous cesarean delivery affecting pregnancy, antepartum on her problem list.  Patient reports no complaints.  Contractions: Irritability. States BH contractions 4x/day at most Vag. Bleeding: None.  Movement: Present. Denies leaking of fluid.   The following portions of the patient's history were reviewed and updated as appropriate: allergies, current medications, past family history, past medical history, past social history, past surgical history and problem list. Problem list updated.  Objective:   Vitals:   12/05/16 0755  BP: 113/71  Pulse: 78  Weight: 156 lb 3.2 oz (70.9 kg)    Fetal Status: Fetal Heart Rate (bpm): 147 Fundal Height: 21 cm Movement: Present     General:  Alert, oriented and cooperative. Patient is in no acute distress.  Skin: Skin is warm and dry. No rash noted.   Cardiovascular: Normal heart rate noted  Respiratory: Normal respiratory effort, no problems with respiration noted  Abdomen: Soft, gravid, appropriate for gestational age. Pain/Pressure: Present     Pelvic:  Cervical exam deferred        Extremities: Normal range of motion.  Edema: None  Mental Status: Normal mood and affect. Normal behavior. Normal judgment and thought content.   Assessment and Plan:  Pregnancy: [id number] at 7937w5d  1. Encounter for supervision of low-risk pregnancy, antepartum - Doing well, no complaints  2. Previous cesarean delivery affecting pregnancy, antepartum - Plans TOLAC, will sign consent in third trimester  3. Anxiety and depression - Feels well today, declines need [facility]   Preterm labor symptoms and general obstetric precautions including but not limited to vaginal  bleeding, contractions, leaking of fluid and fetal movement were reviewed in detail with the patient. Please refer to After Visit Summary for other counseling recommendations.  Return in about 4 weeks (around 01/02/2017) for LOB.   Vonzella NippleJulie Wenzel, PA-C

## 2016-12-05 NOTE — Progress Notes (Signed)
Breastfeeding tip reviewed 

## 2016-12-05 NOTE — Patient Instructions (Addendum)
Second Trimester of Pregnancy The second trimester is from week 13 through week 28, month 4 through 6. This is often the time in pregnancy that you feel your best. Often times, morning sickness has lessened or quit. You may have more energy, and you may get hungry more often. Your unborn baby (fetus) is growing rapidly. At the end of the sixth month, he or she is about 9 inches long and weighs about 1 pounds. You will likely feel the baby move (quickening) between 18 and 20 weeks of pregnancy. Follow these instructions at home:  Avoid all smoking, herbs, and alcohol. Avoid drugs not approved by your doctor.  Do not use any tobacco products, including cigarettes, chewing tobacco, and electronic cigarettes. If you need help quitting, ask your doctor. You may get counseling or other support to help you quit.  Only take medicine as told by your doctor. Some medicines are safe and some are not during pregnancy.  Exercise only as told by your doctor. Stop exercising if you start having cramps.  Eat regular, healthy meals.  Wear a good support bra if your breasts are tender.  Do not use hot tubs, steam rooms, or saunas.  Wear your seat belt when driving.  Avoid raw meat, uncooked cheese, and liter boxes and soil used by cats.  Take your prenatal vitamins.  Take 1500-2000 milligrams of calcium daily starting at the 20th week of pregnancy until you deliver your baby.  Try taking medicine that helps you poop (stool softener) as needed, and if your doctor approves. Eat more fiber by eating fresh fruit, vegetables, and whole grains. Drink enough fluids to keep your pee (urine) clear or pale yellow.  Take warm water baths (sitz baths) to soothe pain or discomfort caused by hemorrhoids. Use hemorrhoid cream if your doctor approves.  If you have puffy, bulging veins (varicose veins), wear support hose. Raise (elevate) your feet for 15 minutes, 3-4 times a day. Limit salt in your diet.  Avoid heavy  lifting, wear low heals, and sit up straight.  Rest with your legs raised if you have leg cramps or low back pain.  Visit your dentist if you have not gone during your pregnancy. Use a soft toothbrush to brush your teeth. Be gentle when you floss.  You can have sex (intercourse) unless your doctor tells you not to.  Go to your doctor visits. Get help if:  You feel dizzy.  You have mild cramps or pressure in your lower belly (abdomen).  You have a nagging pain in your belly area.  You continue to feel sick to your stomach (nauseous), throw up (vomit), or have watery poop (diarrhea).  You have bad smelling fluid coming from your vagina.  You have pain with peeing (urination). Get help right away if:  You have a fever.  You are leaking fluid from your vagina.  You have spotting or bleeding from your vagina.  You have severe belly cramping or pain.  You lose or gain weight rapidly.  You have trouble catching your breath and have chest pain.  You notice sudden or extreme puffiness (swelling) of your face, hands, ankles, feet, or legs.  You have not felt the baby move in over an hour.  You have severe headaches that do not go away with medicine.  You have vision changes. This information is not intended to replace advice given to you by your health care provider. Make sure you discuss any questions you have with your health care   provider. Document Released: 09/11/2009 Document Revised: 11/23/2015 Document Reviewed: 08/18/2012 Elsevier Interactive Patient Education  2017 [place] [address] [place], [place]  [address] Phone - [phone]   Fax - [phone]  [facility] [address] [place], [place] [address] Phone - [phone]   Fax - [place] [address] [place], [place]  [address] Phone - [phone]   Fax -  [phone]  [place] [address] [place] [address] [place], [place]  [address] Phone - [phone]   Fax - [phone]  [place] [address] [place], [place]  [address] Phone - [phone]   Fax - [phone]  [facility] [address] [place], [place]  [address] Phone - [phone]   Fax - [place] [address], [place], [place]  [address] Phone - [phone]   Fax - [phone]  [place] [address] [place], [place] [address] [place], [place]  [address] Phone - [phone]   Fax - [place] [address] [place], [place]  [address] Phone - [phone]   Fax - [phone] [facility] [place] [address] [place], [place]  [address] Phone - [phone]   Fax - [phone]  [place] [address] [place], [place]  [address] Phone - [phone]   Fax - [phone]  [facility] [place], [place], [place]  [address] Phone - [phone]   Fax - [place] [address] [place], [place]  [address] Phone - [phone]   Fax - [phone]  [facility] [address], [place], [place]  [address] Phone - [place], [place]  [address] Phone - [phone]   Fax - [place] [address] [place], [place]  [address] Phone - [phone]   Fax - [phone]  [place] [address] [place], [place]  [address] Phone - [phone]   Fax - [place]. [place], [place]  [address] Phone - [phone]   Fax - [place], [place], [place]   [address] Phone - [phone]   Fax - [place] [address] [place], [place]  [address] Phone - [phone]   Fax - [phone]  [place] [address] [place], [place]  [address] Phone - [phone]   Fax - [place] [address], [place], [place]  [address] Phone - [phone]   Fax - [phone]  [place] [address] [place], [place]  [address] Phone - [phone]   Fax - [phone] [place] [address] [place], [place]  [address] Phone - [phone]   Fax - [place] [address] [place],   [address] Phone - [phone]   Fax - [place] [address], [place],   [address] Phone - [phone]   Fax - [phone]  [place] [address] [place] [address] Phone  [phone]  Fax [phone]

## 2017-01-02 ENCOUNTER — Encounter: Payer: Self-pay | Admitting: Student

## 2017-01-08 ENCOUNTER — Encounter: Payer: Self-pay | Admitting: Advanced Practice Midwife

## 2017-01-08 ENCOUNTER — Other Ambulatory Visit: Payer: Self-pay | Admitting: Family Medicine

## 2017-01-08 ENCOUNTER — Ambulatory Visit (INDEPENDENT_AMBULATORY_CARE_PROVIDER_SITE_OTHER): Payer: 59 | Admitting: Advanced Practice Midwife

## 2017-01-08 ENCOUNTER — Ambulatory Visit (HOSPITAL_COMMUNITY)
Admission: RE | Admit: 2017-01-08 | Discharge: 2017-01-08 | Disposition: A | Discharge status: Home / Self Care | Payer: Medicaid Other | Source: Ambulatory Visit | Attending: Family Medicine | Admitting: Family Medicine

## 2017-01-08 VITALS — BP 124/92 | HR 79 | Wt 161.0 lb

## 2017-01-08 DIAGNOSIS — Z362 Encounter for other antenatal screening follow-up: Secondary | ICD-10-CM | POA: Insufficient documentation

## 2017-01-08 DIAGNOSIS — Z3A26 26 weeks gestation of pregnancy: Secondary | ICD-10-CM | POA: Diagnosis not present

## 2017-01-08 DIAGNOSIS — O35EXX0 Maternal care for other (suspected) fetal abnormality and damage, fetal genitourinary anomalies, not applicable or unspecified: Secondary | ICD-10-CM

## 2017-01-08 DIAGNOSIS — Z23 Encounter for immunization: Secondary | ICD-10-CM | POA: Diagnosis not present

## 2017-01-08 DIAGNOSIS — O358XX0 Maternal care for other (suspected) fetal abnormality and damage, not applicable or unspecified: Secondary | ICD-10-CM

## 2017-01-08 DIAGNOSIS — Z3492 Encounter for supervision of normal pregnancy, unspecified, second trimester: Secondary | ICD-10-CM

## 2017-01-08 LAB — POCT URINALYSIS DIP (DEVICE)
Bilirubin Urine: NEGATIVE
Glucose, UA: NEGATIVE mg/dL
HGB URINE DIPSTICK: NEGATIVE
KETONES UR: NEGATIVE mg/dL
NITRITE: NEGATIVE
PH: 7.5 (ref 5.0–8.0)
PROTEIN: NEGATIVE mg/dL
Specific Gravity, Urine: 1.015 (ref 1.005–1.030)
Urobilinogen, UA: 0.2 mg/dL (ref 0.0–1.0)

## 2017-01-08 NOTE — Addendum Note (Signed)
Addended by: Cheree DittoGRAHAM, Yanet Balliet A on: 01/08/2017 10:57 AM   Modules accepted: Orders

## 2017-01-08 NOTE — Patient Instructions (Signed)
Third Trimester of Pregnancy The third trimester is from week 28 through week 40 (months 7 through 9). The third trimester is a time when the unborn baby (fetus) is growing rapidly. At the end of the ninth month, the fetus is about 20 inches in length and weighs 6-10 pounds. Body changes during your third trimester Your body will continue to go through many changes during pregnancy. The changes vary from woman to woman. During the third trimester:  Your weight will continue to increase. You can expect to gain 25-35 pounds (11-16 kg) by the end of the pregnancy.  You may begin to get stretch marks on your hips, abdomen, and breasts.  You may urinate more often because the fetus is moving lower into your pelvis and pressing on your bladder.  You may develop or continue to have heartburn. This is caused by increased hormones that slow down muscles in the digestive tract.  You may develop or continue to have constipation because increased hormones slow digestion and cause the muscles that push waste through your intestines to relax.  You may develop hemorrhoids. These are swollen veins (varicose veins) in the rectum that can itch or be painful.  You may develop swollen, bulging veins (varicose veins) in your legs.  You may have increased body aches in the pelvis, back, or thighs. This is due to weight gain and increased hormones that are relaxing your joints.  You may have changes in your hair. These can include thickening of your hair, rapid growth, and changes in texture. Some women also have hair loss during or after pregnancy, or hair that feels dry or thin. Your hair will most likely return to normal after your baby is born.  Your breasts will continue to grow and they will continue to become tender. A yellow fluid (colostrum) may leak from your breasts. This is the first milk you are producing for your baby.  Your belly button may stick out.  You may notice more swelling in your hands,  face, or ankles.  You may have increased tingling or numbness in your hands, arms, and legs. The skin on your belly may also feel numb.  You may feel short of breath because of your expanding uterus.  You may have more problems sleeping. This can be caused by the size of your belly, increased need to urinate, and an increase in your body's metabolism.  You may notice the fetus "dropping," or moving lower in your abdomen (lightening).  You may have increased vaginal discharge.  You may notice your joints feel loose and you may have pain around your pelvic bone.  What to expect at prenatal visits You will have prenatal exams every 2 weeks until week 36. Then you will have weekly prenatal exams. During a routine prenatal visit:  You will be weighed to make sure you and the baby are growing normally.  Your blood pressure will be taken.  Your abdomen will be measured to track your baby's growth.  The fetal heartbeat will be listened to.  Any test results from the previous visit will be discussed.  You may have a cervical check near your due date to see if your cervix has softened or thinned (effaced).  You will be tested for Group B streptococcus. This happens between 35 and 37 weeks.  Your health care provider may ask you:  What your birth plan is.  How you are feeling.  If you are feeling the baby move.  If you have had   any abnormal symptoms, such as leaking fluid, bleeding, severe headaches, or abdominal cramping.  If you are using any tobacco products, including cigarettes, chewing tobacco, and electronic cigarettes.  If you have any questions.  Other tests or screenings that may be performed during your third trimester include:  Blood tests that check for low iron levels (anemia).  Fetal testing to check the health, activity level, and growth of the fetus. Testing is done if you have certain medical conditions or if there are problems during the  pregnancy.  Nonstress test (NST). This test checks the health of your baby to make sure there are no signs of problems, such as the baby not getting enough oxygen. During this test, a belt is placed around your belly. The baby is made to move, and its heart rate is monitored during movement.  What is false labor? False labor is a condition in which you feel small, irregular tightenings of the muscles in the womb (contractions) that usually go away with rest, changing position, or drinking water. These are called Braxton Hicks contractions. Contractions may last for hours, days, or even weeks before true labor sets in. If contractions come at regular intervals, become more frequent, increase in intensity, or become painful, you should see your health care provider. What are the signs of labor?  Abdominal cramps.  Regular contractions that start at 10 minutes apart and become stronger and more frequent with time.  Contractions that start on the top of the uterus and spread down to the lower abdomen and back.  Increased pelvic pressure and dull back pain.  A watery or bloody mucus discharge that comes from the vagina.  Leaking of amniotic fluid. This is also known as your "water breaking." It could be a slow trickle or a gush. Let your health care provider know if it has a color or strange odor. If you have any of these signs, call your health care provider right away, even if it is before your due date. Follow these instructions at home: Medicines  Follow your health care provider's instructions regarding medicine use. Specific medicines may be either safe or unsafe to take during pregnancy.  Take a prenatal vitamin that contains at least 600 micrograms (mcg) of folic acid.  If you develop constipation, try taking a stool softener if your health care provider approves. Eating and drinking  Eat a balanced diet that includes fresh fruits and vegetables, whole grains, good sources of protein  such as meat, eggs, or tofu, and low-fat dairy. Your health care provider will help you determine the amount of weight gain that is right for you.  Avoid raw meat and uncooked cheese. These carry germs that can cause birth defects in the baby.  If you have low calcium intake from food, talk to your health care provider about whether you should take a daily calcium supplement.  Eat four or five small meals rather than three large meals a day.  Limit foods that are high in fat and processed sugars, such as fried and sweet foods.  To prevent constipation: ? Drink enough fluid to keep your urine clear or pale yellow. ? Eat foods that are high in fiber, such as fresh fruits and vegetables, whole grains, and beans. Activity  Exercise only as directed by your health care provider. Most women can continue their usual exercise routine during pregnancy. Try to exercise for 30 minutes at least 5 days a week. Stop exercising if you experience uterine contractions.  Avoid heavy   lifting.  Do not exercise in extreme heat or humidity, or at high altitudes.  Wear low-heel, comfortable shoes.  Practice good posture.  You may continue to have sex unless your health care provider tells you otherwise. Relieving pain and discomfort  Take frequent breaks and rest with your legs elevated if you have leg cramps or low back pain.  Take warm sitz baths to soothe any pain or discomfort caused by hemorrhoids. Use hemorrhoid cream if your health care provider approves.  Wear a good support bra to prevent discomfort from breast tenderness.  If you develop varicose veins: ? Wear support pantyhose or compression stockings as told by your healthcare provider. ? Elevate your feet for 15 minutes, 3-4 times a day. Prenatal care  Write down your questions. Take them to your prenatal visits.  Keep all your prenatal visits as told by your health care provider. This is important. Safety  Wear your seat belt at  all times when driving.  Make a list of emergency phone numbers, including numbers for family, friends, the hospital, and police and fire departments. General instructions  Avoid cat litter boxes and soil used by cats. These carry germs that can cause birth defects in the baby. If you have a cat, ask someone to clean the litter box for you.  Do not travel far distances unless it is absolutely necessary and only with the approval of your health care provider.  Do not use hot tubs, steam rooms, or saunas.  Do not drink alcohol.  Do not use any products that contain nicotine or tobacco, such as cigarettes and e-cigarettes. If you need help quitting, ask your health care provider.  Do not use any medicinal herbs or unprescribed drugs. These chemicals affect the formation and growth of the baby.  Do not douche or use tampons or scented sanitary pads.  Do not cross your legs for long periods of time.  To prepare for the arrival of your baby: ? Take prenatal classes to understand, practice, and ask questions about labor and delivery. ? Make a trial run to the hospital. ? Visit the hospital and tour the maternity area. ? Arrange for maternity or paternity leave through employers. ? Arrange for family and friends to take care of pets while you are in the hospital. ? Purchase a rear-facing car seat and make sure you know how to install it in your car. ? Pack your hospital bag. ? Prepare the baby's nursery. Make sure to remove all pillows and stuffed animals from the baby's crib to prevent suffocation.  Visit your dentist if you have not gone during your pregnancy. Use a soft toothbrush to brush your teeth and be gentle when you floss. Contact a health care provider if:  You are unsure if you are in labor or if your water has broken.  You become dizzy.  You have mild pelvic cramps, pelvic pressure, or nagging pain in your abdominal area.  You have lower back pain.  You have persistent  nausea, vomiting, or diarrhea.  You have an unusual or bad smelling vaginal discharge.  You have pain when you urinate. Get help right away if:  Your water breaks before 37 weeks.  You have regular contractions less than 5 minutes apart before 37 weeks.  You have a fever.  You are leaking fluid from your vagina.  You have spotting or bleeding from your vagina.  You have severe abdominal pain or cramping.  You have rapid weight loss or weight gain.    You have shortness of breath with chest pain.  You notice sudden or extreme swelling of your face, hands, ankles, feet, or legs.  Your baby makes fewer than 10 movements in 2 hours.  You have severe headaches that do not go away when you take medicine.  You have vision changes. Summary  The third trimester is from week 28 through week 40, months 7 through 9. The third trimester is a time when the unborn baby (fetus) is growing rapidly.  During the third trimester, your discomfort may increase as you and your baby continue to gain weight. You may have abdominal, leg, and back pain, sleeping problems, and an increased need to urinate.  During the third trimester your breasts will keep growing and they will continue to become tender. A yellow fluid (colostrum) may leak from your breasts. This is the first milk you are producing for your baby.  False labor is a condition in which you feel small, irregular tightenings of the muscles in the womb (contractions) that eventually go away. These are called Braxton Hicks contractions. Contractions may last for hours, days, or even weeks before true labor sets in.  Signs of labor can include: abdominal cramps; regular contractions that start at 10 minutes apart and become stronger and more frequent with time; watery or bloody mucus discharge that comes from the vagina; increased pelvic pressure and dull back pain; and leaking of amniotic fluid. This information is not intended to replace advice  given to you by your health care provider. Make sure you discuss any questions you have with your health care provider. Document Released: 06/11/2001 Document Revised: 11/23/2015 Document Reviewed: 08/18/2012 Elsevier Interactive Patient Education  2017 [employer]  

## 2017-01-08 NOTE — Progress Notes (Signed)
   PRENATAL VISIT NOTE  Subjective:  Virginia Guerrero is a 36 y.o. [id number] at 2774w4d being seen today for ongoing prenatal care.  She is currently monitored for the following issues for this low-risk pregnancy and has Anemia; Anxiety and depression; Supervision of low-risk pregnancy; and Previous cesarean delivery affecting pregnancy, antepartum on her problem list.  Patient reports occasional contractions.  Contractions: Irregular.  .  Movement: Present. Denies leaking of fluid.   The following portions of the patient's history were reviewed and updated as appropriate: allergies, current medications, past family history, past medical history, past social history, past surgical history and problem list. Problem list updated.  Objective:   Vitals:   01/08/17 0936 01/08/17 0941  BP:  (!) 124/92  Pulse:  79  Weight: 161 lb (73 kg) 161 lb (73 kg)    Fetal Status: Fetal Heart Rate (bpm): 145   Movement: Present     General:  Alert, oriented and cooperative. Patient is in no acute distress.  Skin: Skin is warm and dry. No rash noted.   Cardiovascular: Normal heart rate noted  Respiratory: Normal respiratory effort, no problems with respiration noted  Abdomen: Soft, gravid, appropriate for gestational age. Pain/Pressure: Present     Pelvic:  Cervical exam deferred       Fundal height 27 cm  Extremities: Normal range of motion.  Edema: None  Mental Status: Normal mood and affect. Normal behavior. Normal judgment and thought content.   Assessment and Plan:  Pregnancy: [id number] at 2274w4d  1. Encounter for supervision of low-risk pregnancy in second trimester  - Glucose Tolerance, 2 Hours w/1 Hour - HIV antibody - CBC - RPR - Tdap today   2. Previous c-section x1   - Planning TOLAC -Consent signed today  Preterm labor symptoms and general obstetric precautions including but not limited to vaginal bleeding, contractions, leaking of fluid and fetal movement were reviewed in detail with  the patient. Please refer to After Visit Summary for other counseling recommendations.  Return in about 2 weeks (around 01/22/2017).   Thressa ShellerHeather Hogan, CNM

## 2017-01-09 LAB — HIV ANTIBODY (ROUTINE TESTING W REFLEX): HIV Screen 4th Generation wRfx: NONREACTIVE

## 2017-01-09 LAB — GLUCOSE TOLERANCE, 2 HOURS W/ 1HR
GLUCOSE, 2 HOUR: 69 mg/dL (ref 65–152)
Glucose, 1 hour: 81 mg/dL (ref 65–179)
Glucose, Fasting: 71 mg/dL (ref 65–91)

## 2017-01-09 LAB — CBC
HEMATOCRIT: 32.5 % — AB (ref 34.0–46.6)
HEMOGLOBIN: 10.1 g/dL — AB (ref 11.1–15.9)
MCH: 25.3 pg — AB (ref 26.6–33.0)
MCHC: 31.1 g/dL — AB (ref 31.5–35.7)
MCV: 81 fL (ref 79–97)
Platelets: 272 10*3/uL (ref 150–379)
RBC: 4 x10E6/uL (ref 3.77–5.28)
RDW: 14.8 % (ref 12.3–15.4)
WBC: 4.7 10*3/uL (ref 3.4–10.8)

## 2017-01-09 LAB — RPR: RPR Ser Ql: NONREACTIVE

## 2017-01-10 LAB — URINE CULTURE, OB REFLEX

## 2017-01-10 LAB — CULTURE, OB URINE

## 2017-01-20 ENCOUNTER — Other Ambulatory Visit (HOSPITAL_COMMUNITY): Payer: Self-pay | Admitting: *Deleted

## 2017-01-20 DIAGNOSIS — O359XX0 Maternal care for (suspected) fetal abnormality and damage, unspecified, not applicable or unspecified: Secondary | ICD-10-CM

## 2017-02-05 ENCOUNTER — Encounter: Payer: Self-pay | Admitting: Obstetrics and Gynecology

## 2017-02-18 ENCOUNTER — Ambulatory Visit (HOSPITAL_COMMUNITY)
Admission: RE | Admit: 2017-02-18 | Discharge: 2017-02-18 | Disposition: A | Discharge status: Home / Self Care | Payer: 59 | Source: Ambulatory Visit | Attending: Maternal and Fetal Medicine | Admitting: Maternal and Fetal Medicine

## 2017-02-18 ENCOUNTER — Other Ambulatory Visit (HOSPITAL_COMMUNITY): Payer: Self-pay | Admitting: Maternal and Fetal Medicine

## 2017-02-18 DIAGNOSIS — O34219 Maternal care for unspecified type scar from previous cesarean delivery: Secondary | ICD-10-CM | POA: Diagnosis not present

## 2017-02-18 DIAGNOSIS — O359XX0 Maternal care for (suspected) fetal abnormality and damage, unspecified, not applicable or unspecified: Secondary | ICD-10-CM

## 2017-02-18 DIAGNOSIS — Z3A32 32 weeks gestation of pregnancy: Secondary | ICD-10-CM | POA: Diagnosis not present

## 2017-02-18 DIAGNOSIS — O09219 Supervision of pregnancy with history of pre-term labor, unspecified trimester: Secondary | ICD-10-CM

## 2017-02-20 ENCOUNTER — Ambulatory Visit (HOSPITAL_COMMUNITY): Payer: Medicaid Other

## 2017-02-20 ENCOUNTER — Ambulatory Visit (INDEPENDENT_AMBULATORY_CARE_PROVIDER_SITE_OTHER): Payer: 59 | Admitting: Medical

## 2017-02-20 DIAGNOSIS — Z3490 Encounter for supervision of normal pregnancy, unspecified, unspecified trimester: Secondary | ICD-10-CM

## 2017-02-20 DIAGNOSIS — Z3493 Encounter for supervision of normal pregnancy, unspecified, third trimester: Secondary | ICD-10-CM

## 2017-02-20 NOTE — Progress Notes (Signed)
   PRENATAL VISIT NOTE  Subjective:  Virginia Guerrero is a 36 y.o. [id number] at [redacted]w[redacted]d being seen today for ongoing prenatal care.  She is currently monitored for the following issues for this high-risk pregnancy and has Anemia; Anxiety and depression; Supervision of low-risk pregnancy; and Previous cesarean delivery affecting pregnancy, antepartum on her problem list.  Patient reports no complaints.  Contractions: Irregular. Vag. Bleeding: None.  Movement: Present. Denies leaking of fluid.   The following portions of the patient's history were reviewed and updated as appropriate: allergies, current medications, past family history, past medical history, past social history, past surgical history and problem list. Problem list updated.  Objective:   Vitals:   02/20/17 0803  BP: 120/86  Pulse: 79  Weight: 189 lb 3.2 oz (85.8 kg)    Fetal Status: Fetal Heart Rate (bpm): 138 Fundal Height: 33 cm Movement: Present     General:  Alert, oriented and cooperative. Patient is in no acute distress.  Skin: Skin is warm and dry. No rash noted.   Cardiovascular: Normal heart rate noted  Respiratory: Normal respiratory effort, no problems with respiration noted  Abdomen: Soft, gravid, appropriate for gestational age.  Pain/Pressure: Present     Pelvic: Cervical exam deferred        Extremities: Normal range of motion.  Edema: Trace  Mental Status:  Normal mood and affect. Normal behavior. Normal judgment and thought content.   Assessment and Plan:  Pregnancy: [name] at [redacted]w[redacted]d  1. Encounter for supervision of low-risk pregnancy, antepartum - Doing well, no complaints - GBS and GC/Chlamydia at next visit   Preterm labor symptoms and general obstetric precautions including but not limited to vaginal bleeding, contractions, leaking of fluid and fetal movement were reviewed in detail with the patient. Please refer to After Visit Summary for other counseling recommendations.  Return in about 2 weeks  (around 03/06/2017) for LOB.   Vonzella Nipple, PA-C

## 2017-02-20 NOTE — Patient Instructions (Signed)
Fetal Movement Counts °Patient Name: ________________________________________________ Patient Due Date: ____________________ °What is a fetal movement count? °A fetal movement count is the number of times that you feel your baby move during a certain amount of time. This may also be called a fetal kick count. A fetal movement count is recommended for every pregnant woman. You may be asked to start counting fetal movements as early as week 28 of your pregnancy. °Pay attention to when your baby is most active. You may notice your baby's sleep and wake cycles. You may also notice things that make your baby move more. You should do a fetal movement count: °· When your baby is normally most active. °· At the same time each day. ° °A good time to count movements is while you are resting, after having something to eat and drink. °How do I count fetal movements? °1. Find a quiet, comfortable area. Sit, or lie down on your side. °2. Write down the date, the start time and stop time, and the number of movements that you felt between those two times. Take this information with you to your health care visits. °3. For 2 hours, count kicks, flutters, swishes, rolls, and jabs. You should feel at least 10 movements during 2 hours. °4. You may stop counting after you have felt 10 movements. °5. If you do not feel 10 movements in 2 hours, have something to eat and drink. Then, keep resting and counting for 1 hour. If you feel at least 4 movements during that hour, you may stop counting. °Contact a health care provider if: °· You feel fewer than 4 movements in 2 hours. °· Your baby is not moving like he or she usually does. °Date: ____________ Start time: ____________ Stop time: ____________ Movements: ____________ °Date: ____________ Start time: ____________ Stop time: ____________ Movements: ____________ °Date: ____________ Start time: ____________ Stop time: ____________ Movements: ____________ °Date: ____________ Start time:  ____________ Stop time: ____________ Movements: ____________ °Date: ____________ Start time: ____________ Stop time: ____________ Movements: ____________ °Date: ____________ Start time: ____________ Stop time: ____________ Movements: ____________ °Date: ____________ Start time: ____________ Stop time: ____________ Movements: ____________ °Date: ____________ Start time: ____________ Stop time: ____________ Movements: ____________ °Date: ____________ Start time: ____________ Stop time: ____________ Movements: ____________ °This information is not intended to replace advice given to you by your health care provider. Make sure you discuss any questions you have with your health care provider. °Document Released: 07/17/2006 Document Revised: 02/14/2016 Document Reviewed: 07/27/2015 °Elsevier Interactive Patient Education © 2018 [employer] °Braxton Hicks Contractions °Contractions of the uterus can occur throughout pregnancy, but they are not always a sign that you are in labor. You may have practice contractions called Braxton Hicks contractions. These false labor contractions are sometimes confused with true labor. °What are Braxton Hicks contractions? °Braxton Hicks contractions are tightening movements that occur in the muscles of the uterus before labor. Unlike true labor contractions, these contractions do not result in opening (dilation) and thinning of the cervix. Toward the end of pregnancy (32-34 weeks), Braxton Hicks contractions can happen more often and may become stronger. These contractions are sometimes difficult to tell apart from true labor because they can be very uncomfortable. You should not feel embarrassed if you go to the hospital with false labor. °Sometimes, the only way to tell if you are in true labor is for your health care provider to look for changes in the cervix. The health care provider will do a physical exam and may monitor your contractions. If   you are not in true labor, the exam  should show that your cervix is not dilating and your water has not broken. °If there are no prenatal problems or other health problems associated with your pregnancy, it is completely safe for you to be sent home with false labor. You may continue to have Braxton Hicks contractions until you go into true labor. °How can I tell the difference between true labor and false labor? °· Differences °? False labor °? Contractions last 30-70 seconds.: Contractions are usually shorter and not as strong as true labor contractions. °? Contractions become very regular.: Contractions are usually irregular. °? Discomfort is usually felt in the top of the uterus, and it spreads to the lower abdomen and low back.: Contractions are often felt in the front of the lower abdomen and in the groin. °? Contractions do not go away with walking.: Contractions may go away when you walk around or change positions while lying down. °? Contractions usually become more intense and increase in frequency.: Contractions get weaker and are shorter-lasting as time goes on. °? The cervix dilates and gets thinner.: The cervix usually does not dilate or become thin. °Follow these instructions at home: °· Take over-the-counter and prescription medicines only as told by your health care provider. °· Keep up with your usual exercises and follow other instructions from your health care provider. °· Eat and drink lightly if you think you are going into labor. °· If Braxton Hicks contractions are making you uncomfortable: °? Change your position from lying down or resting to walking, or change from walking to resting. °? Sit and rest in a tub of warm water. °? Drink enough fluid to keep your urine clear or pale yellow. Dehydration may cause these contractions. °? Do slow and deep breathing several times an hour. °· Keep all follow-up prenatal visits as told by your health care provider. This is important. °Contact a health care provider if: °· You have a  fever. °· You have continuous pain in your abdomen. °Get help right away if: °· Your contractions become stronger, more regular, and closer together. °· You have fluid leaking or gushing from your vagina. °· You pass blood-tinged mucus (bloody show). °· You have bleeding from your vagina. °· You have low back pain that you never had before. °· You feel your baby’s head pushing down and causing pelvic pressure. °· Your baby is not moving inside you as much as it used to. °Summary °· Contractions that occur before labor are called Braxton Hicks contractions, false labor, or practice contractions. °· Braxton Hicks contractions are usually shorter, weaker, farther apart, and less regular than true labor contractions. True labor contractions usually become progressively stronger and regular and they become more frequent. °· Manage discomfort from Braxton Hicks contractions by changing position, resting in a warm bath, drinking plenty of water, or practicing deep breathing. °This information is not intended to replace advice given to you by your health care provider. Make sure you discuss any questions you have with your health care provider. °Document Released: 06/17/2005 Document Revised: 05/06/2016 Document Reviewed: 05/06/2016 °Elsevier Interactive Patient Education © 2017 [employer] ° °

## 2017-03-06 ENCOUNTER — Ambulatory Visit (INDEPENDENT_AMBULATORY_CARE_PROVIDER_SITE_OTHER): Payer: 59 | Admitting: Obstetrics and Gynecology

## 2017-03-06 VITALS — BP 130/64 | HR 82 | Wt 196.5 lb

## 2017-03-06 DIAGNOSIS — O34219 Maternal care for unspecified type scar from previous cesarean delivery: Secondary | ICD-10-CM

## 2017-03-06 DIAGNOSIS — Z3493 Encounter for supervision of normal pregnancy, unspecified, third trimester: Secondary | ICD-10-CM

## 2017-03-06 NOTE — Progress Notes (Signed)
Lots of contractions

## 2017-03-06 NOTE — Patient Instructions (Signed)

## 2017-03-07 NOTE — Progress Notes (Signed)
Subjective:  Virginia Guerrero is a 36 y.o. [id number] at 2213w6d being seen today for ongoing prenatal care.  She is currently monitored for the following issues for this low-risk pregnancy and has Anemia; Anxiety and depression; Supervision of low-risk pregnancy; and Previous cesarean delivery affecting pregnancy, antepartum on her problem list.  Patient reports no complaints.  Contractions: Irregular. Vag. Bleeding: None.  Movement: Present. Denies leaking of fluid.   The following portions of the patient's history were reviewed and updated as appropriate: allergies, current medications, past family history, past medical history, past social history, past surgical history and problem list. Problem list updated.  Objective:   Vitals:   03/06/17 1612  BP: 130/64  Pulse: 82  Weight: 196 lb 8 oz (89.1 kg)    Fetal Status: Fetal Heart Rate (bpm): 150 Fundal Height: 35 cm Movement: Present  Presentation: Vertex  General:  Alert, oriented and cooperative. Patient is in no acute distress.  Skin: Skin is warm and dry. No rash noted.   Cardiovascular: Normal heart rate noted  Respiratory: Normal respiratory effort, no problems with respiration noted  Abdomen: Soft, gravid, appropriate for gestational age. Pain/Pressure: Present     Pelvic: Vag. Bleeding: None     Cervical exam performed Dilation: 1 Effacement (%): Thick Station: -2  Extremities: Normal range of motion.  Edema: Trace  Mental Status: Normal mood and affect. Normal behavior. Normal judgment and thought content.   Urinalysis:      Assessment and Plan:  Pregnancy: [id number] at 5313w6d  1. Encounter for supervision of low-risk pregnancy in third trimester Continue routine prenatal care. Doing well. Starting to have contractions. No h/o preterm labors. Cervical check reassuring.  2. Previous cesarean delivery affecting pregnancy, [facility] has [facility] signed.   Preterm labor symptoms and general obstetric  precautions including but not limited to vaginal bleeding, contractions, leaking of fluid and fetal movement were reviewed in detail with the patient. Please refer to After Visit Summary for other counseling recommendations.  Return in about 2 weeks (around 03/20/2017) for ob visit.   Caryl AdaJazma Josphine Laffey, DO OB Fellow [facility], [facility] [place]

## 2017-03-13 ENCOUNTER — Ambulatory Visit (INDEPENDENT_AMBULATORY_CARE_PROVIDER_SITE_OTHER): Payer: 59 | Admitting: Advanced Practice Midwife

## 2017-03-13 ENCOUNTER — Other Ambulatory Visit (HOSPITAL_COMMUNITY)
Admission: RE | Admit: 2017-03-13 | Discharge: 2017-03-13 | Disposition: A | Discharge status: Home / Self Care | Payer: Medicaid Other | Source: Ambulatory Visit | Attending: Advanced Practice Midwife | Admitting: Advanced Practice Midwife

## 2017-03-13 VITALS — BP 122/85 | HR 78 | Wt 195.8 lb

## 2017-03-13 DIAGNOSIS — Z113 Encounter for screening for infections with a predominantly sexual mode of transmission: Secondary | ICD-10-CM | POA: Diagnosis not present

## 2017-03-13 DIAGNOSIS — Z3A36 36 weeks gestation of pregnancy: Secondary | ICD-10-CM

## 2017-03-13 DIAGNOSIS — Z3493 Encounter for supervision of normal pregnancy, unspecified, third trimester: Secondary | ICD-10-CM | POA: Insufficient documentation

## 2017-03-13 DIAGNOSIS — Z331 Pregnant state, incidental: Secondary | ICD-10-CM | POA: Diagnosis not present

## 2017-03-13 DIAGNOSIS — Z98891 History of uterine scar from previous surgery: Secondary | ICD-10-CM

## 2017-03-13 NOTE — Progress Notes (Signed)
   PRENATAL VISIT NOTE  Subjective:  Virginia Guerrero is a 36 y.o. [id number] at 5844w5d being seen today for ongoing prenatal care.  She is currently monitored for the following issues for this low-risk pregnancy and has Anemia; Anxiety and depression; Supervision of low-risk pregnancy; and Previous cesarean delivery affecting pregnancy, antepartum on her problem list.  Patient reports backache.  Contractions: Irregular. Vag. Bleeding: None.  Movement: Present. Denies leaking of fluid. Denies any HA, visual disturbances or RUQ pain.   The following portions of the patient's history were reviewed and updated as appropriate: allergies, current medications, past family history, past medical history, past social history, past surgical history and problem list. Problem list updated.  Objective:   Vitals:   03/13/17 0819 03/13/17 0828  BP: (!) 139/94 122/85  Pulse: 78   Weight: 195 lb 12.8 oz (88.8 kg)     Fetal Status: Fetal Heart Rate (bpm): 135   Movement: Present     General:  Alert, oriented and cooperative. Patient is in no acute distress.  Skin: Skin is warm and dry. No rash noted.   Cardiovascular: Normal heart rate noted  Respiratory: Normal respiratory effort, no problems with respiration noted  Abdomen: Soft, gravid, appropriate for gestational age.  Pain/Pressure: Present     Pelvic: Cervical exam performed        Extremities: Normal range of motion.  Edema: None  Mental Status:  Normal mood and affect. Normal behavior. Normal judgment and thought content.   Assessment and Plan:  Pregnancy: [id number] at 4244w5d  1. Encounter for supervision of low-risk pregnancy in third trimester - Strep Gp B NAA - Cervicovaginal ancillary only  2. [redacted] weeks gestation of pregnancy GBS today   3. Previous cesarean section Plans [facility], papers signed 01/08/17   One elevated blood pressure, will get B/P check on Monday.   Preterm labor symptoms and general obstetric precautions including but not  limited to vaginal bleeding, contractions, leaking of fluid and fetal movement were reviewed in detail with the patient. Please refer to After Visit Summary for other counseling recommendations.  Return in about 1 week (around 03/20/2017).   Thressa ShellerHeather Jafet Wissing, CNM

## 2017-03-13 NOTE — Patient Instructions (Signed)
Third Trimester of Pregnancy The third trimester is from week 28 through week 40 (months 7 through 9). The third trimester is a time when the unborn baby (fetus) is growing rapidly. At the end of the ninth month, the fetus is about 20 inches in length and weighs 6-10 pounds. Body changes during your third trimester Your body will continue to go through many changes during pregnancy. The changes vary from woman to woman. During the third trimester:  Your weight will continue to increase. You can expect to gain 25-35 pounds (11-16 kg) by the end of the pregnancy.  You may begin to get stretch marks on your hips, abdomen, and breasts.  You may urinate more often because the fetus is moving lower into your pelvis and pressing on your bladder.  You may develop or continue to have heartburn. This is caused by increased hormones that slow down muscles in the digestive tract.  You may develop or continue to have constipation because increased hormones slow digestion and cause the muscles that push waste through your intestines to relax.  You may develop hemorrhoids. These are swollen veins (varicose veins) in the rectum that can itch or be painful.  You may develop swollen, bulging veins (varicose veins) in your legs.  You may have increased body aches in the pelvis, back, or thighs. This is due to weight gain and increased hormones that are relaxing your joints.  You may have changes in your hair. These can include thickening of your hair, rapid growth, and changes in texture. Some women also have hair loss during or after pregnancy, or hair that feels dry or thin. Your hair will most likely return to normal after your baby is born.  Your breasts will continue to grow and they will continue to become tender. A yellow fluid (colostrum) may leak from your breasts. This is the first milk you are producing for your baby.  Your belly button may stick out.  You may notice more swelling in your hands,  face, or ankles.  You may have increased tingling or numbness in your hands, arms, and legs. The skin on your belly may also feel numb.  You may feel short of breath because of your expanding uterus.  You may have more problems sleeping. This can be caused by the size of your belly, increased need to urinate, and an increase in your body's metabolism.  You may notice the fetus "dropping," or moving lower in your abdomen (lightening).  You may have increased vaginal discharge.  You may notice your joints feel loose and you may have pain around your pelvic bone.  What to expect at prenatal visits You will have prenatal exams every 2 weeks until week 36. Then you will have weekly prenatal exams. During a routine prenatal visit:  You will be weighed to make sure you and the baby are growing normally.  Your blood pressure will be taken.  Your abdomen will be measured to track your baby's growth.  The fetal heartbeat will be listened to.  Any test results from the previous visit will be discussed.  You may have a cervical check near your due date to see if your cervix has softened or thinned (effaced).  You will be tested for Group B streptococcus. This happens between 35 and 37 weeks.  Your health care provider may ask you:  What your birth plan is.  How you are feeling.  If you are feeling the baby move.  If you have had   any abnormal symptoms, such as leaking fluid, bleeding, severe headaches, or abdominal cramping.  If you are using any tobacco products, including cigarettes, chewing tobacco, and electronic cigarettes.  If you have any questions.  Other tests or screenings that may be performed during your third trimester include:  Blood tests that check for low iron levels (anemia).  Fetal testing to check the health, activity level, and growth of the fetus. Testing is done if you have certain medical conditions or if there are problems during the  pregnancy.  Nonstress test (NST). This test checks the health of your baby to make sure there are no signs of problems, such as the baby not getting enough oxygen. During this test, a belt is placed around your belly. The baby is made to move, and its heart rate is monitored during movement.  What is false labor? False labor is a condition in which you feel small, irregular tightenings of the muscles in the womb (contractions) that usually go away with rest, changing position, or drinking water. These are called Braxton Hicks contractions. Contractions may last for hours, days, or even weeks before true labor sets in. If contractions come at regular intervals, become more frequent, increase in intensity, or become painful, you should see your health care provider. What are the signs of labor?  Abdominal cramps.  Regular contractions that start at 10 minutes apart and become stronger and more frequent with time.  Contractions that start on the top of the uterus and spread down to the lower abdomen and back.  Increased pelvic pressure and dull back pain.  A watery or bloody mucus discharge that comes from the vagina.  Leaking of amniotic fluid. This is also known as your "water breaking." It could be a slow trickle or a gush. Let your health care provider know if it has a color or strange odor. If you have any of these signs, call your health care provider right away, even if it is before your due date. Follow these instructions at home: Medicines  Follow your health care provider's instructions regarding medicine use. Specific medicines may be either safe or unsafe to take during pregnancy.  Take a prenatal vitamin that contains at least 600 micrograms (mcg) of folic acid.  If you develop constipation, try taking a stool softener if your health care provider approves. Eating and drinking  Eat a balanced diet that includes fresh fruits and vegetables, whole grains, good sources of protein  such as meat, eggs, or tofu, and low-fat dairy. Your health care provider will help you determine the amount of weight gain that is right for you.  Avoid raw meat and uncooked cheese. These carry germs that can cause birth defects in the baby.  If you have low calcium intake from food, talk to your health care provider about whether you should take a daily calcium supplement.  Eat four or five small meals rather than three large meals a day.  Limit foods that are high in fat and processed sugars, such as fried and sweet foods.  To prevent constipation: ? Drink enough fluid to keep your urine clear or pale yellow. ? Eat foods that are high in fiber, such as fresh fruits and vegetables, whole grains, and beans. Activity  Exercise only as directed by your health care provider. Most women can continue their usual exercise routine during pregnancy. Try to exercise for 30 minutes at least 5 days a week. Stop exercising if you experience uterine contractions.  Avoid heavy   lifting.  Do not exercise in extreme heat or humidity, or at high altitudes.  Wear low-heel, comfortable shoes.  Practice good posture.  You may continue to have sex unless your health care provider tells you otherwise. Relieving pain and discomfort  Take frequent breaks and rest with your legs elevated if you have leg cramps or low back pain.  Take warm sitz baths to soothe any pain or discomfort caused by hemorrhoids. Use hemorrhoid cream if your health care provider approves.  Wear a good support bra to prevent discomfort from breast tenderness.  If you develop varicose veins: ? Wear support pantyhose or compression stockings as told by your healthcare provider. ? Elevate your feet for 15 minutes, 3-4 times a day. Prenatal care  Write down your questions. Take them to your prenatal visits.  Keep all your prenatal visits as told by your health care provider. This is important. Safety  Wear your seat belt at  all times when driving.  Make a list of emergency phone numbers, including numbers for family, friends, the hospital, and police and fire departments. General instructions  Avoid cat litter boxes and soil used by cats. These carry germs that can cause birth defects in the baby. If you have a cat, ask someone to clean the litter box for you.  Do not travel far distances unless it is absolutely necessary and only with the approval of your health care provider.  Do not use hot tubs, steam rooms, or saunas.  Do not drink alcohol.  Do not use any products that contain nicotine or tobacco, such as cigarettes and e-cigarettes. If you need help quitting, ask your health care provider.  Do not use any medicinal herbs or unprescribed drugs. These chemicals affect the formation and growth of the baby.  Do not douche or use tampons or scented sanitary pads.  Do not cross your legs for long periods of time.  To prepare for the arrival of your baby: ? Take prenatal classes to understand, practice, and ask questions about labor and delivery. ? Make a trial run to the hospital. ? Visit the hospital and tour the maternity area. ? Arrange for maternity or paternity leave through employers. ? Arrange for family and friends to take care of pets while you are in the hospital. ? Purchase a rear-facing car seat and make sure you know how to install it in your car. ? Pack your hospital bag. ? Prepare the baby's nursery. Make sure to remove all pillows and stuffed animals from the baby's crib to prevent suffocation.  Visit your dentist if you have not gone during your pregnancy. Use a soft toothbrush to brush your teeth and be gentle when you floss. Contact a health care provider if:  You are unsure if you are in labor or if your water has broken.  You become dizzy.  You have mild pelvic cramps, pelvic pressure, or nagging pain in your abdominal area.  You have lower back pain.  You have persistent  nausea, vomiting, or diarrhea.  You have an unusual or bad smelling vaginal discharge.  You have pain when you urinate. Get help right away if:  Your water breaks before 37 weeks.  You have regular contractions less than 5 minutes apart before 37 weeks.  You have a fever.  You are leaking fluid from your vagina.  You have spotting or bleeding from your vagina.  You have severe abdominal pain or cramping.  You have rapid weight loss or weight gain.    You have shortness of breath with chest pain.  You notice sudden or extreme swelling of your face, hands, ankles, feet, or legs.  Your baby makes fewer than 10 movements in 2 hours.  You have severe headaches that do not go away when you take medicine.  You have vision changes. Summary  The third trimester is from week 28 through week 40, months 7 through 9. The third trimester is a time when the unborn baby (fetus) is growing rapidly.  During the third trimester, your discomfort may increase as you and your baby continue to gain weight. You may have abdominal, leg, and back pain, sleeping problems, and an increased need to urinate.  During the third trimester your breasts will keep growing and they will continue to become tender. A yellow fluid (colostrum) may leak from your breasts. This is the first milk you are producing for your baby.  False labor is a condition in which you feel small, irregular tightenings of the muscles in the womb (contractions) that eventually go away. These are called Braxton Hicks contractions. Contractions may last for hours, days, or even weeks before true labor sets in.  Signs of labor can include: abdominal cramps; regular contractions that start at 10 minutes apart and become stronger and more frequent with time; watery or bloody mucus discharge that comes from the vagina; increased pelvic pressure and dull back pain; and leaking of amniotic fluid. This information is not intended to replace advice  given to you by your health care provider. Make sure you discuss any questions you have with your health care provider. Document Released: 06/11/2001 Document Revised: 11/23/2015 Document Reviewed: 08/18/2012 Elsevier Interactive Patient Education  2017 [employer]  Places to have your son circumcised:    [phone] $480 by 4 wks  [facility] [phone] $244 by 4 wks  [facility] [phone] $175 by 2 wks  [facility] [phone] $250 by 7 days [facility] [phone] $150 by 4 wks  These prices sometimes change but are roughly what you can expect to pay. Please call and confirm pricing.   Circumcision is considered an elective/non-medically necessary procedure. There are many reasons parents decide to have their sons circumsized. During the first year of life circumcised males have a reduced risk of urinary tract infections but after this year the rates between circumcised males and uncircumcised males are the same.  It is safe to have your son circumcised outside of the hospital and the places above perform them regularly.

## 2017-03-14 LAB — CERVICOVAGINAL ANCILLARY ONLY
CHLAMYDIA, DNA PROBE: NEGATIVE
Neisseria Gonorrhea: NEGATIVE

## 2017-03-14 LAB — OB RESULTS CONSOLE GC/CHLAMYDIA: GC PROBE AMP, GENITAL: NEGATIVE

## 2017-03-15 LAB — STREP GP B NAA: STREP GROUP B AG: NEGATIVE

## 2017-03-20 ENCOUNTER — Ambulatory Visit (INDEPENDENT_AMBULATORY_CARE_PROVIDER_SITE_OTHER): Payer: Medicaid Other | Admitting: Advanced Practice Midwife

## 2017-03-20 VITALS — BP 132/82 | HR 78 | Wt 199.0 lb

## 2017-03-20 DIAGNOSIS — Z3480 Encounter for supervision of other normal pregnancy, unspecified trimester: Secondary | ICD-10-CM

## 2017-03-20 DIAGNOSIS — Z3483 Encounter for supervision of other normal pregnancy, third trimester: Secondary | ICD-10-CM

## 2017-03-20 NOTE — Patient Instructions (Addendum)
Places to have your son circumcised:    [facility] [phone] while you are in hospital  [facility] [phone] $244 by 4 wks  [facility] [phone] $175 by 2 wks  [facility] [phone] $250 by 7 days [facility] [phone] $150 by 4 wks  These prices sometimes change but are roughly what you can expect to pay. Please call and confirm pricing.   Circumcision is considered an elective/non-medically necessary procedure. There are many reasons parents decide to have their sons circumsized. During the first year of life circumcised males have a reduced risk of urinary tract infections but after this year the rates between circumcised males and uncircumcised males are the same.  It is safe to have your son circumcised outside of the hospital and the places above perform them regularly.    Third Trimester of Pregnancy The third trimester is from week 28 through week 40 (months 7 through 9). The third trimester is a time when the unborn baby (fetus) is growing rapidly. At the end of the ninth month, the fetus is about 20 inches in length and weighs 6-10 pounds. Body changes during your third trimester Your body will continue to go through many changes during pregnancy. The changes vary from woman to woman. During the third trimester:  Your weight will continue to increase. You can expect to gain 25-35 pounds (11-16 kg) by the end of the pregnancy.  You may begin to get stretch marks on your hips, abdomen, and breasts.  You may urinate more often because the fetus is moving lower into your pelvis and pressing on your bladder.  You may develop or continue to have heartburn. This is caused by increased hormones that slow down muscles in the digestive tract.  You may develop or continue to have constipation because increased  hormones slow digestion and cause the muscles that push waste through your intestines to relax.  You may develop hemorrhoids. These are swollen veins (varicose veins) in the rectum that can itch or be painful.  You may develop swollen, bulging veins (varicose veins) in your legs.  You may have increased body aches in the pelvis, back, or thighs. This is due to weight gain and increased hormones that are relaxing your joints.  You may have changes in your hair. These can include thickening of your hair, rapid growth, and changes in texture. Some women also have hair loss during or after pregnancy, or hair that feels dry or thin. Your hair will most likely return to normal after your baby is born.  Your breasts will continue to grow and they will continue to become tender. A yellow fluid (colostrum) may leak from your breasts. This is the first milk you are producing for your baby.  Your belly button may stick out.  You may notice more swelling in your hands, face, or ankles.  You may have increased tingling or numbness in your hands, arms, and legs. The skin on your belly may also feel numb.  You may feel short of breath because of your expanding uterus.  You may have more problems sleeping. This can be caused by the size of your belly, increased need to urinate, and an increase in your body's metabolism.  You may notice the fetus "dropping," or moving lower in your abdomen (lightening).  You may have increased vaginal discharge.  You may notice your joints feel loose and you may have pain around your pelvic bone.  What to expect at prenatal visits You will have prenatal exams  every 2 weeks until week 36. Then you will have weekly prenatal exams. During a routine prenatal visit:  You will be weighed to make sure you and the baby are growing normally.  Your blood pressure will be taken.  Your abdomen will be measured to track your baby's growth.  The fetal heartbeat will be  listened to.  Any test results from the previous visit will be discussed.  You may have a cervical check near your due date to see if your cervix has softened or thinned (effaced).  You will be tested for Group B streptococcus. This happens between 35 and 37 weeks.  Your health care provider may ask you:  What your birth plan is.  How you are feeling.  If you are feeling the baby move.  If you have had any abnormal symptoms, such as leaking fluid, bleeding, severe headaches, or abdominal cramping.  If you are using any tobacco products, including cigarettes, chewing tobacco, and electronic cigarettes.  If you have any questions.  Other tests or screenings that may be performed during your third trimester include:  Blood tests that check for low iron levels (anemia).  Fetal testing to check the health, activity level, and growth of the fetus. Testing is done if you have certain medical conditions or if there are problems during the pregnancy.  Nonstress test (NST). This test checks the health of your baby to make sure there are no signs of problems, such as the baby not getting enough oxygen. During this test, a belt is placed around your belly. The baby is made to move, and its heart rate is monitored during movement.  What is false labor? False labor is a condition in which you feel small, irregular tightenings of the muscles in the womb (contractions) that usually go away with rest, changing position, or drinking water. These are called Braxton Hicks contractions. Contractions may last for hours, days, or even weeks before true labor sets in. If contractions come at regular intervals, become more frequent, increase in intensity, or become painful, you should see your health care provider. What are the signs of labor?  Abdominal cramps.  Regular contractions that start at 10 minutes apart and become stronger and more frequent with time.  Contractions that start on the top of  the uterus and spread down to the lower abdomen and back.  Increased pelvic pressure and dull back pain.  A watery or bloody mucus discharge that comes from the vagina.  Leaking of amniotic fluid. This is also known as your "water breaking." It could be a slow trickle or a gush. Let your health care provider know if it has a color or strange odor. If you have any of these signs, call your health care provider right away, even if it is before your due date. Follow these instructions at home: Medicines  Follow your health care provider's instructions regarding medicine use. Specific medicines may be either safe or unsafe to take during pregnancy.  Take a prenatal vitamin that contains at least 600 micrograms (mcg) of folic acid.  If you develop constipation, try taking a stool softener if your health care provider approves. Eating and drinking  Eat a balanced diet that includes fresh fruits and vegetables, whole grains, good sources of protein such as meat, eggs, or tofu, and low-fat dairy. Your health care provider will help you determine the amount of weight gain that is right for you.  Avoid raw meat and uncooked cheese. These carry germs that  can cause birth defects in the baby.  If you have low calcium intake from food, talk to your health care provider about whether you should take a daily calcium supplement.  Eat four or five small meals rather than three large meals a day.  Limit foods that are high in fat and processed sugars, such as fried and sweet foods.  To prevent constipation: ? Drink enough fluid to keep your urine clear or pale yellow. ? Eat foods that are high in fiber, such as fresh fruits and vegetables, whole grains, and beans. Activity  Exercise only as directed by your health care provider. Most women can continue their usual exercise routine during pregnancy. Try to exercise for 30 minutes at least 5 days a week. Stop exercising if you experience uterine  contractions.  Avoid heavy lifting.  Do not exercise in extreme heat or humidity, or at high altitudes.  Wear low-heel, comfortable shoes.  Practice good posture.  You may continue to have sex unless your health care provider tells you otherwise. Relieving pain and discomfort  Take frequent breaks and rest with your legs elevated if you have leg cramps or low back pain.  Take warm sitz baths to soothe any pain or discomfort caused by hemorrhoids. Use hemorrhoid cream if your health care provider approves.  Wear a good support bra to prevent discomfort from breast tenderness.  If you develop varicose veins: ? Wear support pantyhose or compression stockings as told by your healthcare provider. ? Elevate your feet for 15 minutes, 3-4 times a day. Prenatal care  Write down your questions. Take them to your prenatal visits.  Keep all your prenatal visits as told by your health care provider. This is important. Safety  Wear your seat belt at all times when driving.  Make a list of emergency phone numbers, including numbers for family, friends, the hospital, and police and fire departments. General instructions  Avoid cat litter boxes and soil used by cats. These carry germs that can cause birth defects in the baby. If you have a cat, ask someone to clean the litter box for you.  Do not travel far distances unless it is absolutely necessary and only with the approval of your health care provider.  Do not use hot tubs, steam rooms, or saunas.  Do not drink alcohol.  Do not use any products that contain nicotine or tobacco, such as cigarettes and e-cigarettes. If you need help quitting, ask your health care provider.  Do not use any medicinal herbs or unprescribed drugs. These chemicals affect the formation and growth of the baby.  Do not douche or use tampons or scented sanitary pads.  Do not cross your legs for long periods of time.  To prepare for the arrival of your  baby: ? Take prenatal classes to understand, practice, and ask questions about labor and delivery. ? Make a trial run to the hospital. ? Visit the hospital and tour the maternity area. ? Arrange for maternity or paternity leave through employers. ? Arrange for family and friends to take care of pets while you are in the hospital. ? Purchase a rear-facing car seat and make sure you know how to install it in your car. ? Pack your hospital bag. ? Prepare the baby's nursery. Make sure to remove all pillows and stuffed animals from the baby's crib to prevent suffocation.  Visit your dentist if you have not gone during your pregnancy. Use a soft toothbrush to brush your teeth and be  gentle when you floss. Contact a health care provider if:  You are unsure if you are in labor or if your water has broken.  You become dizzy.  You have mild pelvic cramps, pelvic pressure, or nagging pain in your abdominal area.  You have lower back pain.  You have persistent nausea, vomiting, or diarrhea.  You have an unusual or bad smelling vaginal discharge.  You have pain when you urinate. Get help right away if:  Your water breaks before 37 weeks.  You have regular contractions less than 5 minutes apart before 37 weeks.  You have a fever.  You are leaking fluid from your vagina.  You have spotting or bleeding from your vagina.  You have severe abdominal pain or cramping.  You have rapid weight loss or weight gain.  You have shortness of breath with chest pain.  You notice sudden or extreme swelling of your face, hands, ankles, feet, or legs.  Your baby makes fewer than 10 movements in 2 hours.  You have severe headaches that do not go away when you take medicine.  You have vision changes. Summary  The third trimester is from week 28 through week 40, months 7 through 9. The third trimester is a time when the unborn baby (fetus) is growing rapidly.  During the third trimester, your  discomfort may increase as you and your baby continue to gain weight. You may have abdominal, leg, and back pain, sleeping problems, and an increased need to urinate.  During the third trimester your breasts will keep growing and they will continue to become tender. A yellow fluid (colostrum) may leak from your breasts. This is the first milk you are producing for your baby.  False labor is a condition in which you feel small, irregular tightenings of the muscles in the womb (contractions) that eventually go away. These are called Braxton Hicks contractions. Contractions may last for hours, days, or even weeks before true labor sets in.  Signs of labor can include: abdominal cramps; regular contractions that start at 10 minutes apart and become stronger and more frequent with time; watery or bloody mucus discharge that comes from the vagina; increased pelvic pressure and dull back pain; and leaking of amniotic fluid. This information is not intended to replace advice given to you by your health care provider. Make sure you discuss any questions you have with your health care provider. Document Released: 06/11/2001 Document Revised: 11/23/2015 Document Reviewed: 08/18/2012 Elsevier Interactive Patient Education  2017 [employer].

## 2017-03-20 NOTE — Progress Notes (Signed)
   PRENATAL VISIT NOTE  Subjective:  Ezelle Wernli is a 36 y.o. [id number] at [redacted]w[redacted]d being seen today for ongoing prenatal care.  She is currently monitored for the following issues for this low-risk pregnancy and has Anemia; Anxiety and depression; Supervision of low-risk pregnancy; and Previous cesarean delivery affecting pregnancy, antepartum on her problem list.  Patient reports no complaints.   .  .  Movement: Present. Denies leaking of fluid.   The following portions of the patient's history were reviewed and updated as appropriate: allergies, current medications, past family history, past medical history, past social history, past surgical history and problem list. Problem list updated.  Objective:   Vitals:   03/20/17 0857  BP: 132/82  Pulse: 78  Weight: 199 lb (90.3 kg)    Fetal Status: Fetal Heart Rate (bpm): 131   Movement: Present     General:  Alert, oriented and cooperative. Patient is in no acute distress.  Skin: Skin is warm and dry. No rash noted.   Cardiovascular: Normal heart rate noted  Respiratory: Normal respiratory effort, no problems with respiration noted  Abdomen: Soft, gravid, appropriate for gestational age.  Pain/Pressure: Present     Pelvic: Cervical exam deferred        Extremities: Normal range of motion.  Edema: None  Mental Status:  Normal mood and affect. Normal behavior. Normal judgment and thought content.   Assessment and Plan:  Pregnancy: [id number] at [redacted]w[redacted]d  There are no diagnoses linked to this encounter. Term labor symptoms and general obstetric precautions including but not limited to vaginal bleeding, contractions, leaking of fluid and fetal movement were reviewed in detail with the patient. Please refer to After Visit Summary for other counseling recommendations.  No Follow-up on file.   Thressa Sheller, CNM

## 2017-03-27 ENCOUNTER — Inpatient Hospital Stay (HOSPITAL_COMMUNITY)
Admission: AD | Admit: 2017-03-27 | Discharge: 2017-03-31 | DRG: 788 | Disposition: A | Discharge status: Home / Self Care | Payer: Medicaid Other | Source: Ambulatory Visit | Attending: Obstetrics & Gynecology | Admitting: Obstetrics & Gynecology

## 2017-03-27 ENCOUNTER — Ambulatory Visit (INDEPENDENT_AMBULATORY_CARE_PROVIDER_SITE_OTHER): Payer: Medicaid Other | Admitting: Certified Nurse Midwife

## 2017-03-27 ENCOUNTER — Encounter (HOSPITAL_COMMUNITY): Payer: Self-pay | Admitting: *Deleted

## 2017-03-27 VITALS — BP 137/85 | HR 87 | Wt 203.9 lb

## 2017-03-27 DIAGNOSIS — Z3493 Encounter for supervision of normal pregnancy, unspecified, third trimester: Secondary | ICD-10-CM

## 2017-03-27 DIAGNOSIS — O1414 Severe pre-eclampsia complicating childbirth: Secondary | ICD-10-CM | POA: Diagnosis present

## 2017-03-27 DIAGNOSIS — O34211 Maternal care for low transverse scar from previous cesarean delivery: Secondary | ICD-10-CM | POA: Diagnosis present

## 2017-03-27 DIAGNOSIS — O2603 Excessive weight gain in pregnancy, third trimester: Secondary | ICD-10-CM | POA: Diagnosis present

## 2017-03-27 DIAGNOSIS — O34219 Maternal care for unspecified type scar from previous cesarean delivery: Secondary | ICD-10-CM

## 2017-03-27 DIAGNOSIS — O2600 Excessive weight gain in pregnancy, unspecified trimester: Secondary | ICD-10-CM | POA: Insufficient documentation

## 2017-03-27 DIAGNOSIS — O9081 Anemia of the puerperium: Secondary | ICD-10-CM | POA: Diagnosis present

## 2017-03-27 DIAGNOSIS — Z029 Encounter for administrative examinations, unspecified: Secondary | ICD-10-CM

## 2017-03-27 DIAGNOSIS — Z91040 Latex allergy status: Secondary | ICD-10-CM | POA: Diagnosis not present

## 2017-03-27 DIAGNOSIS — O1413 Severe pre-eclampsia, third trimester: Secondary | ICD-10-CM | POA: Diagnosis not present

## 2017-03-27 DIAGNOSIS — Z3A37 37 weeks gestation of pregnancy: Secondary | ICD-10-CM | POA: Diagnosis not present

## 2017-03-27 DIAGNOSIS — O133 Gestational [pregnancy-induced] hypertension without significant proteinuria, third trimester: Secondary | ICD-10-CM

## 2017-03-27 DIAGNOSIS — Z98891 History of uterine scar from previous surgery: Secondary | ICD-10-CM

## 2017-03-27 LAB — TYPE AND SCREEN
ABO/RH(D): AB POS
Antibody Screen: NEGATIVE

## 2017-03-27 LAB — COMPREHENSIVE METABOLIC PANEL
ALK PHOS: 149 U/L — AB (ref 38–126)
ALT: 15 U/L (ref 14–54)
AST: 23 U/L (ref 15–41)
Albumin: 3.2 g/dL — ABNORMAL LOW (ref 3.5–5.0)
Anion gap: 11 (ref 5–15)
BILIRUBIN TOTAL: 0.4 mg/dL (ref 0.3–1.2)
BUN: 6 mg/dL (ref 6–20)
CHLORIDE: 105 mmol/L (ref 101–111)
CO2: 20 mmol/L — ABNORMAL LOW (ref 22–32)
CREATININE: 0.58 mg/dL (ref 0.44–1.00)
Calcium: 8.9 mg/dL (ref 8.9–10.3)
GFR calc Af Amer: >60 mL/min (ref >60)
GFR calc non Af Amer: >60 mL/min (ref >60)
Glucose, Bld: 82 mg/dL (ref 65–99)
Potassium: 3.7 mmol/L (ref 3.5–5.1)
Sodium: 136 mmol/L (ref 135–145)
Total Protein: 7.2 g/dL (ref 6.5–8.1)

## 2017-03-27 LAB — CBC
HEMATOCRIT: 34.3 % — AB (ref 36.0–46.0)
Hemoglobin: 11.3 g/dL — ABNORMAL LOW (ref 12.0–15.0)
MCH: 26.5 pg (ref 26.0–34.0)
MCHC: 32.9 g/dL (ref 30.0–36.0)
MCV: 80.5 fL (ref 78.0–100.0)
Platelets: 243 10*3/uL (ref 150–400)
RBC: 4.26 MIL/uL (ref 3.87–5.11)
RDW: 15.6 % — AB (ref 11.5–15.5)
WBC: 7.4 10*3/uL (ref 4.0–10.5)

## 2017-03-27 LAB — POCT URINALYSIS DIP (DEVICE)
BILIRUBIN URINE: NEGATIVE
Glucose, UA: NEGATIVE mg/dL
HGB URINE DIPSTICK: NEGATIVE
Ketones, ur: NEGATIVE mg/dL
NITRITE: NEGATIVE
PH: 7 (ref 5.0–8.0)
Protein, ur: NEGATIVE mg/dL
SPECIFIC GRAVITY, URINE: 1.02 (ref 1.005–1.030)
Urobilinogen, UA: 0.2 mg/dL (ref 0.0–1.0)

## 2017-03-27 LAB — PROTEIN / CREATININE RATIO, URINE
Creatinine, Urine: 88 mg/dL
Protein Creatinine Ratio: 0.2 mg/mg{Cre} — ABNORMAL HIGH (ref 0.00–0.15)
Total Protein, Urine: 18 mg/dL

## 2017-03-27 MED ORDER — LABETALOL HCL 5 MG/ML IV SOLN
20.0000 mg | INTRAVENOUS | Status: DC | PRN
  Administered 2017-03-28: 20 mg via INTRAVENOUS
  Filled 2017-03-27: qty 4

## 2017-03-27 MED ORDER — MISOPROSTOL 50MCG HALF TABLET: 50.0000 ug | ORAL_TABLET | ORAL | Status: DC

## 2017-03-27 MED ORDER — ACETAMINOPHEN 325 MG PO TABS: 650.0000 mg | ORAL_TABLET | ORAL | Status: DC | PRN

## 2017-03-27 MED ORDER — LACTATED RINGERS IV SOLN
INTRAVENOUS | Status: DC
  Administered 2017-03-27 – 2017-03-28 (×3): via INTRAVENOUS

## 2017-03-27 MED ORDER — OXYTOCIN 40 UNITS IN LACTATED RINGERS INFUSION - SIMPLE MED: 2.5000 [IU]/h | INTRAVENOUS | Status: DC

## 2017-03-27 MED ORDER — FLEET ENEMA 7-19 GM/118ML RE ENEM: 1.0000 | ENEMA | RECTAL | Status: DC | PRN

## 2017-03-27 MED ORDER — ONDANSETRON HCL 4 MG/2ML IJ SOLN: 4.0000 mg | Freq: Four times a day (QID) | INTRAMUSCULAR | Status: DC | PRN

## 2017-03-27 MED ORDER — LACTATED RINGERS IV SOLN: 500.0000 mL | INTRAVENOUS | Status: DC | PRN

## 2017-03-27 MED ORDER — SOD CITRATE-CITRIC ACID 500-334 MG/5ML PO SOLN
30.0000 mL | ORAL | Status: DC | PRN
  Administered 2017-03-28: 30 mL via ORAL
  Filled 2017-03-27: qty 15

## 2017-03-27 MED ORDER — OXYTOCIN BOLUS FROM INFUSION: 500.0000 mL | Freq: Once | INTRAVENOUS | Status: DC

## 2017-03-27 MED ORDER — OXYTOCIN 40 UNITS IN LACTATED RINGERS INFUSION - SIMPLE MED
1.0000 m[IU]/min | INTRAVENOUS | Status: DC
  Administered 2017-03-27: 2 m[IU]/min via INTRAVENOUS
  Administered 2017-03-28: 14 m[IU]/min via INTRAVENOUS
  Filled 2017-03-27 (×2): qty 1000

## 2017-03-27 MED ORDER — MAGNESIUM SULFATE BOLUS VIA INFUSION
4.0000 g | Freq: Once | INTRAVENOUS | Status: AC
  Administered 2017-03-27: 4 g via INTRAVENOUS
  Filled 2017-03-27: qty 500

## 2017-03-27 MED ORDER — MAGNESIUM SULFATE 40 G IN LACTATED RINGERS - SIMPLE
2.0000 g/h | INTRAVENOUS | Status: AC
  Administered 2017-03-28 – 2017-03-29 (×2): 2 g/h via INTRAVENOUS
  Filled 2017-03-27 (×2): qty 40
  Filled 2017-03-27 (×2): qty 500

## 2017-03-27 MED ORDER — FENTANYL CITRATE (PF) 100 MCG/2ML IJ SOLN
100.0000 ug | INTRAMUSCULAR | Status: DC | PRN
  Administered 2017-03-28: 100 ug via INTRAVENOUS
  Filled 2017-03-27: qty 2

## 2017-03-27 MED ORDER — TERBUTALINE SULFATE 1 MG/ML IJ SOLN
0.2500 mg | Freq: Once | INTRAMUSCULAR | Status: DC | PRN
  Filled 2017-03-27: qty 1

## 2017-03-27 MED ORDER — OXYCODONE-ACETAMINOPHEN 5-325 MG PO TABS: 1.0000 | ORAL_TABLET | ORAL | Status: DC | PRN

## 2017-03-27 MED ORDER — LIDOCAINE HCL (PF) 1 % IJ SOLN
30.0000 mL | INTRAMUSCULAR | Status: DC | PRN
  Filled 2017-03-27: qty 30

## 2017-03-27 MED ORDER — OXYCODONE-ACETAMINOPHEN 5-325 MG PO TABS: 2.0000 | ORAL_TABLET | ORAL | Status: DC | PRN

## 2017-03-27 MED ORDER — TERBUTALINE SULFATE 1 MG/ML IJ SOLN
0.2500 mg | Freq: Once | INTRAMUSCULAR | Status: AC | PRN
  Administered 2017-03-28: 0.25 mg via SUBCUTANEOUS

## 2017-03-27 MED ORDER — HYDRALAZINE HCL 20 MG/ML IJ SOLN: 10.0000 mg | Freq: Once | INTRAMUSCULAR | Status: DC | PRN

## 2017-03-27 NOTE — Progress Notes (Signed)
Labor Progress Note Virginia Guerrero is a 36 y.o. [id number] at [redacted]w[redacted]d presented for IOL 2/2 preeclampsia with severe features. S: Patient is feeling well  O:  BP 139/86   Pulse 83   Temp 98.1 F (36.7 C) (Oral)   Resp 19   Ht  (1.651 m)   Wt 203 lb (92.1 kg)   LMP 07/06/2016 (Exact Date)   BMI 33.78 kg/m  EFM: 150/mod var/no decels  CVE: Dilation: 1 Effacement (%): Thick Station: -3 Presentation: Vertex Exam by:: DR Rachelle Hora   A&P: 36 y.o. [id number] [redacted]w[redacted]d here for IOL 2/2 preeclampsia with severe features. #Labor: Unchanged. Attempted foley bulb placement but unable to place. Continue pitocin. #Pain: well controlled #FWB: cat 1 #GBS negative  Rolm Bookbinder, DO 4:52 PM

## 2017-03-27 NOTE — Progress Notes (Signed)
Subjective:  Virginia Guerrero is a 36 y.o. [id number] at [redacted]w[redacted]d being seen today for ongoing prenatal care.  She is currently monitored for the following issues for this high-risk pregnancy and has Anemia; Anxiety and depression; Supervision of low-risk pregnancy; Previous cesarean delivery affecting pregnancy, antepartum; Excess weight gain in pregnancy; and Gestational hypertension on her problem list.  Patient reports Intermittent HA, blurry vision, and epigastric pain.  Contractions: Irregular. Vag. Bleeding: None.  Movement: Present. Denies leaking of fluid.   The following portions of the patient's history were reviewed and updated as appropriate: allergies, current medications, past family history, past medical history, past social history, past surgical history and problem list. Problem list updated.  Objective:   Vitals:   03/27/17 0841 03/27/17 0847  BP: (!) 142/81 137/85  Pulse: 87   Weight: 203 lb 14.4 oz (92.5 kg)     Fetal Status: Fetal Heart Rate (bpm): 140 Fundal Height: 38 cm Movement: Present  Presentation: Vertex  General:  Alert, oriented and cooperative. Patient is in no acute distress.  Skin: Skin is warm and dry. No rash noted.   Cardiovascular: Normal heart rate noted  Respiratory: Normal respiratory effort, no problems with respiration noted  Abdomen: Soft, gravid, appropriate for gestational age. Pain/Pressure: Present     Pelvic: Vag. Bleeding: None Vag D/C Character: White   Cervical exam performed Dilation: 1 Effacement (%): 50 Station: -3  Extremities: Normal range of motion.  Edema: Trace  Mental Status: Normal mood and affect. Normal behavior. Normal judgment and thought content.   Urinalysis: Urine Protein: Negative    Assessment and Plan:  Pregnancy: [id number] at [redacted]w[redacted]d  1. Encounter for supervision of high-risk pregnancy in third trimester  2. Previous cesarean delivery affecting pregnancy, antepartum - planning TOLAC  3. Excessive weight gain during  pregnancy in third trimester - nml FH  4. Pregnancy-induced hypertension in third trimester - previous elevated BP 2 weeks ago - symptomatic - plan for admit and IOL - pre-e labs on admit  Term labor symptoms and general obstetric precautions including but not limited to vaginal bleeding, contractions, leaking of fluid and fetal movement were reviewed in detail with the patient. Please refer to After Visit Summary for other counseling recommendations.  No Follow-up on file.   Donette Larry, CNM

## 2017-03-27 NOTE — Progress Notes (Signed)
Vitals:   03/27/17 1830 03/27/17 1901  BP: (!) 143/85 (!) 141/84  Pulse: 76 79  Resp: 19 20  Temp:     Foley inserted and inflated w/60 cc H20.  Pit dc'd pt can eat.  Mild ctx q 2-3 minutes

## 2017-03-27 NOTE — Anesthesia Pain Management Evaluation Note (Signed)
  [facility] Pain Management Visit Note  Patient: Virginia Guerrero, 36 y.o., female  "Hello I am a member of the anesthesia team at [facility]. We have an anesthesia team available at all times to provide care throughout the hospital, including epidural management and anesthesia for C-section. I don't know your plan for the delivery whether it a natural birth, water birth, IV sedation, nitrous supplementation, doula or epidural, but we want to meet your pain goals."   1.Was your pain managed to your expectations on prior hospitalizations?   Yes   2.What is your expectation for pain management during this hospitalization?     Epidural and IV pain meds  3.How can we help you reach that goal? Be available  Record the patient's initial score and the patient's pain goal.   Pain: 5  Pain Goal: 10 The [facility] wants you to be able to say your pain was always managed very well.  [facility] 03/27/2017

## 2017-03-27 NOTE — H&P (Signed)
OBSTETRIC ADMISSION HISTORY AND PHYSICAL  Virginia Guerrero is a 36 y.o. female [phone] with IUP at [redacted]w[redacted]d by LMP and 9 week ultrasound presenting for IOL secondary to preeclampsia with severe features. She reports +FMs, No LOF, no VB. Admits to blurry vision, headaches and RUQ pain x 6 days. Has not tried anything for pain.  She plans on breast feeding. She request IUD for birth control. She received her prenatal care at [facility]  Dating: By lmp and 9 week ultrasound --->  Estimated Date of Delivery: 04/12/17  Sono:  02/18/17  , CWD, normal anatomy, vertex presentation,  2065g, 65% EFW   Prenatal History/Complications:  Past Medical History: Past Medical History:  Diagnosis Date  . Abortion, nontherapeutic    in a clinic  . Carpal tunnel syndrome   . Hx of chest pain   . Infections of genitourinary tract in pregnancy, unspecified as to episode of care(646.60)   . No pertinent past medical history   . Painful respiration    costochondral pain  . Syncope and collapse     Past Surgical History: Past Surgical History:  Procedure Laterality Date  . CESAREAN SECTION N/A 11/17/2012   Procedure: CESAREAN SECTION;  Surgeon: Lenoard Aden, MD;  Location: [facility];  Service: Obstetrics;  Laterality: N/A;  . INDUCED ABORTION     in a clinic    Obstetrical History: OB History    Gravida Para Term Preterm AB Living   0 1 2   SAB TAB Ectopic Multiple Live Births   0 1 0 0 2      Social History: Social History   Social History  . Marital status: Single    Spouse name: N/A  . Number of children: N/A  . Years of education: N/A   Social History Main Topics  . Smoking status: Never Smoker  . Smokeless tobacco: Never Used  . Alcohol use No     Comment: not with pregnancy  . Drug use: No     Comment: not since pregnancy  . Sexual activity: Yes    Birth control/ protection: None   Other Topics Concern  . None   Social History Narrative  . None    Family  History: Family History  Problem Relation Age of Onset  . Arthritis Other   . Lung cancer Father   . Cancer Father        nasopharygeal  . Cancer Paternal Uncle        pancreatic  . Hypertension Maternal Grandfather   . Cancer Paternal Grandmother        breast  . Hypertension Mother   . Cancer Maternal Grandmother        lymphoma    Allergies: Allergies  Allergen Reactions  . Latex Itching    condoms    Prescriptions Prior to Admission  Medication Sig Dispense Refill Last Dose  . cephALEXin (KEFLEX) 500 MG capsule Take 1 capsule (500 mg total) by mouth 4 (four) times daily. (Patient not taking: Reported on 12/05/2016) 28 capsule 0 Not Taking  . hydrocortisone (ANUSOL-HC) 2.5 % rectal cream Place 1 application rectally 2 (two) times daily. (Patient not taking: Reported on 12/05/2016) 30 g 0 Not Taking  . Prenatal Vit-Fe Fumarate-FA (MULTIVITAMIN-PRENATAL) 27-0.8 MG TABS tablet Take 1 tablet by mouth daily at 12 noon.   Taking     Review of Systems   All systems reviewed and negative except as stated in HPI  Blood pressure (!) 152/83,  pulse 81, temperature 98.1 F (36.7 C), temperature source Oral, resp. rate 18, last menstrual period 07/06/2016, unknown if currently breastfeeding. General appearance: alert and cooperative Lungs: clear to auscultation bilaterally Heart: regular rate and rhythm Abdomen: soft, non-tender; bowel sounds normal Pelvic: 1/50/-3 Extremities: Homans sign is negative, no sign of DVT DTR's 2/4 bilateral upper and lower extremities Presentation: cephalic Fetal monitoringBaseline: 140 bpm, Variability: Good {> 6 bpm), Accelerations: Reactive and Decelerations: Absent Uterine activity q16min     Prenatal labs: ABO, Rh: AB/Positive/-- (05/10 1532) Antibody: Negative (05/10 1532) Rubella: 6.85 (05/10 1532) RPR: Non Reactive (07/11 1004)  HBsAg: Negative (05/10 1532)  HIV:   negative GBS: Negative (09/13 0932)  1 hr Glucola normal Genetic  screening  quad negative  Prenatal Transfer Tool  Maternal Diabetes: No Genetic Screening: Normal Maternal Ultrasounds/Referrals: Normal Maternal Substance Abuse:  No Significant Maternal Medications:  None Significant Maternal Lab Results: None  Results for orders placed or performed in visit on 03/27/17 (from the past 24 hour(s))  POCT urinalysis dip (device)   Collection Time: 03/27/17  8:49 AM  Result Value Ref Range   Glucose, UA NEGATIVE NEGATIVE mg/dL   Bilirubin Urine NEGATIVE NEGATIVE   Ketones, ur NEGATIVE NEGATIVE mg/dL   Specific Gravity, Urine 1.020 1.005 - 1.030   Hgb urine dipstick NEGATIVE NEGATIVE   pH 7.0 5.0 - 8.0   Protein, ur NEGATIVE NEGATIVE mg/dL   Urobilinogen, UA 0.2 0.0 - 1.0 mg/dL   Nitrite NEGATIVE NEGATIVE   Leukocytes, UA TRACE (A) NEGATIVE    Patient Active Problem List   Diagnosis Date Noted  . Excess weight gain in pregnancy 03/27/2017  . Gestational hypertension 03/27/2017  . PIH (pregnancy induced hypertension) 03/27/2017  . Supervision of low-risk pregnancy 11/07/2016  . Previous cesarean delivery affecting pregnancy, antepartum 11/07/2016  . Anemia 06/20/2011  . Anxiety and depression 06/20/2011    Assessment/Plan:  Virginia Guerrero is a 36 y.o. [id number] at [redacted]w[redacted]d here for IOL secondary to preeclampsia with severe features (headache, RUQ pain, vision changes)  #Labor:will begin induction with pitocin. Unable to place FB, attempt again in few hours #Pain: Well controlled currently #FWB: Cat 1 #ID:  GBS neg #MOF: breast #MOC:IUD  Rolm Bookbinder, DO  03/27/2017, 12:23 PM

## 2017-03-27 NOTE — Progress Notes (Signed)
Discharge with odor C/o blurred vision

## 2017-03-28 ENCOUNTER — Inpatient Hospital Stay (HOSPITAL_COMMUNITY): Payer: Medicaid Other | Admitting: Anesthesiology

## 2017-03-28 ENCOUNTER — Encounter (HOSPITAL_COMMUNITY)
Admission: AD | Disposition: A | Discharge status: Home / Self Care | Payer: Self-pay | Source: Ambulatory Visit | Attending: Obstetrics & Gynecology

## 2017-03-28 ENCOUNTER — Encounter (HOSPITAL_COMMUNITY): Payer: Self-pay

## 2017-03-28 DIAGNOSIS — Z3A37 37 weeks gestation of pregnancy: Secondary | ICD-10-CM

## 2017-03-28 DIAGNOSIS — O1413 Severe pre-eclampsia, third trimester: Secondary | ICD-10-CM

## 2017-03-28 LAB — CBC
HCT: 31.6 % — ABNORMAL LOW (ref 36.0–46.0)
HCT: 31.4 % — ABNORMAL LOW (ref 36.0–46.0)
HEMOGLOBIN: 10.4 g/dL — AB (ref 12.0–15.0)
Hemoglobin: 10.3 g/dL — ABNORMAL LOW (ref 12.0–15.0)
MCH: 26.4 pg (ref 26.0–34.0)
MCH: 26.7 pg (ref 26.0–34.0)
MCHC: 32.6 g/dL (ref 30.0–36.0)
MCHC: 33.1 g/dL (ref 30.0–36.0)
MCV: 81 fL (ref 78.0–100.0)
MCV: 80.5 fL (ref 78.0–100.0)
PLATELETS: 206 10*3/uL (ref 150–400)
Platelets: 230 10*3/uL (ref 150–400)
RBC: 3.9 MIL/uL (ref 3.87–5.11)
RBC: 3.9 MIL/uL (ref 3.87–5.11)
RDW: 15.7 % — AB (ref 11.5–15.5)
RDW: 15.6 % — ABNORMAL HIGH (ref 11.5–15.5)
WBC: 8.1 10*3/uL (ref 4.0–10.5)
WBC: 16.4 10*3/uL — ABNORMAL HIGH (ref 4.0–10.5)

## 2017-03-28 LAB — RPR: RPR: NONREACTIVE

## 2017-03-28 SURGERY — Surgical Case
Anesthesia: Epidural | Wound class: Clean Contaminated

## 2017-03-28 MED ORDER — ONDANSETRON HCL 4 MG/2ML IJ SOLN
INTRAMUSCULAR | Status: DC | PRN
  Administered 2017-03-28: 4 mg via INTRAVENOUS

## 2017-03-28 MED ORDER — DEXTROSE 5 % IV SOLN
500.0000 mg | Freq: Once | INTRAVENOUS | Status: AC
  Administered 2017-03-28: 500 mg via INTRAVENOUS
  Filled 2017-03-28: qty 500

## 2017-03-28 MED ORDER — SODIUM CHLORIDE 0.9% FLUSH
3.0000 mL | INTRAVENOUS | Status: DC | PRN
  Administered 2017-03-30: 3 mL via INTRAVENOUS
  Filled 2017-03-28: qty 3

## 2017-03-28 MED ORDER — BUPIVACAINE HCL (PF) 0.25 % IJ SOLN
INTRAMUSCULAR | Status: DC | PRN
  Administered 2017-03-28: 8 mL via EPIDURAL

## 2017-03-28 MED ORDER — ACETAMINOPHEN 325 MG PO TABS
650.0000 mg | ORAL_TABLET | ORAL | Status: DC | PRN
  Administered 2017-03-29 – 2017-03-30 (×3): 650 mg via ORAL
  Filled 2017-03-28 (×3): qty 2

## 2017-03-28 MED ORDER — KETOROLAC TROMETHAMINE 30 MG/ML IJ SOLN
30.0000 mg | Freq: Four times a day (QID) | INTRAMUSCULAR | Status: AC | PRN
  Administered 2017-03-28: 30 mg via INTRAMUSCULAR

## 2017-03-28 MED ORDER — PRENATAL MULTIVITAMIN CH
1.0000 | ORAL_TABLET | Freq: Every day | ORAL | Status: DC
  Administered 2017-03-29 – 2017-03-31 (×3): 1 via ORAL
  Filled 2017-03-28 (×3): qty 1

## 2017-03-28 MED ORDER — LACTATED RINGERS IV SOLN
500.0000 mL | Freq: Once | INTRAVENOUS | Status: AC
  Administered 2017-03-28: 500 mL via INTRAVENOUS

## 2017-03-28 MED ORDER — CEFAZOLIN SODIUM-DEXTROSE 2-4 GM/100ML-% IV SOLN
INTRAVENOUS | Status: AC
  Filled 2017-03-28: qty 100

## 2017-03-28 MED ORDER — ONDANSETRON HCL 4 MG/2ML IJ SOLN
4.0000 mg | Freq: Three times a day (TID) | INTRAMUSCULAR | Status: DC | PRN
  Administered 2017-03-28: 4 mg via INTRAVENOUS
  Filled 2017-03-28 (×2): qty 2

## 2017-03-28 MED ORDER — NALBUPHINE HCL 10 MG/ML IJ SOLN: 5.0000 mg | INTRAMUSCULAR | Status: DC | PRN

## 2017-03-28 MED ORDER — SODIUM BICARBONATE 8.4 % IV SOLN
INTRAVENOUS | Status: AC
  Filled 2017-03-28: qty 50

## 2017-03-28 MED ORDER — PHENYLEPHRINE 40 MCG/ML (10ML) SYRINGE FOR IV PUSH (FOR BLOOD PRESSURE SUPPORT): 80.0000 ug | PREFILLED_SYRINGE | INTRAVENOUS | Status: DC | PRN

## 2017-03-28 MED ORDER — SIMETHICONE 80 MG PO CHEW
80.0000 mg | CHEWABLE_TABLET | Freq: Three times a day (TID) | ORAL | Status: DC
  Administered 2017-03-28 – 2017-03-31 (×9): 80 mg via ORAL
  Filled 2017-03-28 (×9): qty 1

## 2017-03-28 MED ORDER — MEPERIDINE HCL 25 MG/ML IJ SOLN: 6.2500 mg | INTRAMUSCULAR | Status: DC | PRN

## 2017-03-28 MED ORDER — LACTATED RINGERS IV SOLN
INTRAVENOUS | Status: DC
  Administered 2017-03-28 – 2017-03-29 (×2): via INTRAVENOUS

## 2017-03-28 MED ORDER — MORPHINE SULFATE (PF) 0.5 MG/ML IJ SOLN
INTRAMUSCULAR | Status: AC
  Filled 2017-03-28: qty 10

## 2017-03-28 MED ORDER — DIBUCAINE 1 % RE OINT: 1.0000 "application " | TOPICAL_OINTMENT | RECTAL | Status: DC | PRN

## 2017-03-28 MED ORDER — LIDOCAINE HCL (PF) 1 % IJ SOLN
INTRAMUSCULAR | Status: DC | PRN
  Administered 2017-03-28 (×2): 5 mL

## 2017-03-28 MED ORDER — SODIUM BICARBONATE 8.4 % IV SOLN
INTRAVENOUS | Status: DC | PRN
  Administered 2017-03-28 (×3): 5 mL via EPIDURAL

## 2017-03-28 MED ORDER — NALBUPHINE HCL 10 MG/ML IJ SOLN: 5.0000 mg | Freq: Once | INTRAMUSCULAR | Status: DC | PRN

## 2017-03-28 MED ORDER — OXYTOCIN 40 UNITS IN LACTATED RINGERS INFUSION - SIMPLE MED: 2.5000 [IU]/h | INTRAVENOUS | Status: AC

## 2017-03-28 MED ORDER — KETOROLAC TROMETHAMINE 30 MG/ML IJ SOLN: 30.0000 mg | Freq: Four times a day (QID) | INTRAMUSCULAR | Status: AC | PRN

## 2017-03-28 MED ORDER — LACTATED RINGERS IV SOLN: INTRAVENOUS | Status: DC

## 2017-03-28 MED ORDER — WITCH HAZEL-GLYCERIN EX PADS: 1.0000 "application " | MEDICATED_PAD | CUTANEOUS | Status: DC | PRN

## 2017-03-28 MED ORDER — MORPHINE SULFATE (PF) 0.5 MG/ML IJ SOLN
INTRAMUSCULAR | Status: DC | PRN
  Administered 2017-03-28: 4 mg via EPIDURAL

## 2017-03-28 MED ORDER — FENTANYL 2.5 MCG/ML BUPIVACAINE 1/10 % EPIDURAL INFUSION (WH - ANES)
14.0000 mL/h | INTRAMUSCULAR | Status: DC | PRN
  Administered 2017-03-28: 14 mL/h via EPIDURAL
  Filled 2017-03-28: qty 100

## 2017-03-28 MED ORDER — DIPHENHYDRAMINE HCL 50 MG/ML IJ SOLN
12.5000 mg | INTRAMUSCULAR | Status: DC | PRN
  Administered 2017-03-28: 12.5 mg via INTRAVENOUS
  Filled 2017-03-28: qty 1

## 2017-03-28 MED ORDER — IBUPROFEN 600 MG PO TABS
600.0000 mg | ORAL_TABLET | Freq: Four times a day (QID) | ORAL | Status: DC
  Administered 2017-03-29 – 2017-03-31 (×11): 600 mg via ORAL
  Filled 2017-03-28 (×11): qty 1

## 2017-03-28 MED ORDER — CEFAZOLIN SODIUM-DEXTROSE 2-4 GM/100ML-% IV SOLN
2.0000 g | Freq: Once | INTRAVENOUS | Status: AC
  Administered 2017-03-28: 2 g via INTRAVENOUS
  Filled 2017-03-28: qty 100

## 2017-03-28 MED ORDER — ONDANSETRON HCL 4 MG/2ML IJ SOLN
INTRAMUSCULAR | Status: AC
  Filled 2017-03-28: qty 2

## 2017-03-28 MED ORDER — FENTANYL CITRATE (PF) 100 MCG/2ML IJ SOLN
25.0000 ug | INTRAMUSCULAR | Status: DC | PRN
  Administered 2017-03-28: 25 ug via INTRAVENOUS

## 2017-03-28 MED ORDER — METOCLOPRAMIDE HCL 5 MG/ML IJ SOLN: 10.0000 mg | Freq: Once | INTRAMUSCULAR | Status: DC | PRN

## 2017-03-28 MED ORDER — SIMETHICONE 80 MG PO CHEW: 80.0000 mg | CHEWABLE_TABLET | ORAL | Status: DC | PRN

## 2017-03-28 MED ORDER — ZOLPIDEM TARTRATE 5 MG PO TABS: 5.0000 mg | ORAL_TABLET | Freq: Every evening | ORAL | Status: DC | PRN

## 2017-03-28 MED ORDER — EPHEDRINE 5 MG/ML INJ: 10.0000 mg | INTRAVENOUS | Status: DC | PRN

## 2017-03-28 MED ORDER — SCOPOLAMINE 1 MG/3DAYS TD PT72: 1.0000 | MEDICATED_PATCH | Freq: Once | TRANSDERMAL | Status: DC

## 2017-03-28 MED ORDER — OXYTOCIN 10 UNIT/ML IJ SOLN
INTRAMUSCULAR | Status: AC
  Filled 2017-03-28: qty 4

## 2017-03-28 MED ORDER — NALOXONE HCL 2 MG/2ML IJ SOSY
1.0000 ug/kg/h | PREFILLED_SYRINGE | INTRAVENOUS | Status: DC | PRN
  Filled 2017-03-28: qty 2

## 2017-03-28 MED ORDER — ACETAMINOPHEN 500 MG PO TABS
1000.0000 mg | ORAL_TABLET | Freq: Four times a day (QID) | ORAL | Status: AC
  Administered 2017-03-28 – 2017-03-29 (×3): 1000 mg via ORAL
  Filled 2017-03-28 (×3): qty 2

## 2017-03-28 MED ORDER — DIPHENHYDRAMINE HCL 25 MG PO CAPS
25.0000 mg | ORAL_CAPSULE | ORAL | Status: DC | PRN
  Filled 2017-03-28: qty 1

## 2017-03-28 MED ORDER — FENTANYL CITRATE (PF) 100 MCG/2ML IJ SOLN
INTRAMUSCULAR | Status: AC
  Filled 2017-03-28: qty 2

## 2017-03-28 MED ORDER — DIPHENHYDRAMINE HCL 50 MG/ML IJ SOLN: 12.5000 mg | INTRAMUSCULAR | Status: DC | PRN

## 2017-03-28 MED ORDER — SENNOSIDES-DOCUSATE SODIUM 8.6-50 MG PO TABS
2.0000 | ORAL_TABLET | ORAL | Status: DC
  Administered 2017-03-29 – 2017-03-30 (×3): 2 via ORAL
  Filled 2017-03-28 (×3): qty 2

## 2017-03-28 MED ORDER — LIDOCAINE-EPINEPHRINE (PF) 2 %-1:200000 IJ SOLN
INTRAMUSCULAR | Status: AC
  Filled 2017-03-28: qty 20

## 2017-03-28 MED ORDER — PHENYLEPHRINE 40 MCG/ML (10ML) SYRINGE FOR IV PUSH (FOR BLOOD PRESSURE SUPPORT)
80.0000 ug | PREFILLED_SYRINGE | INTRAVENOUS | Status: DC | PRN
  Filled 2017-03-28: qty 10

## 2017-03-28 MED ORDER — KETOROLAC TROMETHAMINE 30 MG/ML IJ SOLN
INTRAMUSCULAR | Status: AC
  Filled 2017-03-28: qty 1

## 2017-03-28 MED ORDER — SIMETHICONE 80 MG PO CHEW
80.0000 mg | CHEWABLE_TABLET | ORAL | Status: DC
  Administered 2017-03-29 – 2017-03-30 (×3): 80 mg via ORAL
  Filled 2017-03-28 (×3): qty 1

## 2017-03-28 MED ORDER — NALOXONE HCL 0.4 MG/ML IJ SOLN: 0.4000 mg | INTRAMUSCULAR | Status: DC | PRN

## 2017-03-28 MED ORDER — MISOPROSTOL 200 MCG PO TABS
ORAL_TABLET | ORAL | Status: AC
  Filled 2017-03-28: qty 3

## 2017-03-28 MED ORDER — TETANUS-DIPHTH-ACELL PERTUSSIS 5-2.5-18.5 LF-MCG/0.5 IM SUSP
0.5000 mL | Freq: Once | INTRAMUSCULAR | Status: DC
Start: 2017-03-29 — End: 2017-03-31

## 2017-03-28 MED ORDER — COCONUT OIL OIL
1.0000 "application " | TOPICAL_OIL | Status: DC | PRN
  Administered 2017-03-30: 1 via TOPICAL
  Filled 2017-03-28: qty 120

## 2017-03-28 MED ORDER — DEXAMETHASONE SODIUM PHOSPHATE 10 MG/ML IJ SOLN
INTRAMUSCULAR | Status: DC | PRN
  Administered 2017-03-28: 10 mg via INTRAVENOUS

## 2017-03-28 MED ORDER — MENTHOL 3 MG MT LOZG: 1.0000 | LOZENGE | OROMUCOSAL | Status: DC | PRN

## 2017-03-28 MED ORDER — DIPHENHYDRAMINE HCL 25 MG PO CAPS: 25.0000 mg | ORAL_CAPSULE | Freq: Four times a day (QID) | ORAL | Status: DC | PRN

## 2017-03-28 MED ORDER — BUPIVACAINE HCL (PF) 0.5 % IJ SOLN
INTRAMUSCULAR | Status: AC
  Filled 2017-03-28: qty 30

## 2017-03-28 MED ORDER — LACTATED RINGERS IV SOLN
INTRAVENOUS | Status: DC | PRN
  Administered 2017-03-28: 40 [IU] via INTRAVENOUS

## 2017-03-28 MED ORDER — DEXAMETHASONE SODIUM PHOSPHATE 10 MG/ML IJ SOLN
INTRAMUSCULAR | Status: AC
  Filled 2017-03-28: qty 1

## 2017-03-28 MED ORDER — BUPIVACAINE HCL (PF) 0.5 % IJ SOLN
INTRAMUSCULAR | Status: DC | PRN
  Administered 2017-03-28: 25 mL

## 2017-03-28 MED ORDER — SODIUM CHLORIDE 0.9 % IR SOLN
Status: DC | PRN
  Administered 2017-03-28: 1000 mL

## 2017-03-28 MED ORDER — LACTATED RINGERS IV SOLN
INTRAVENOUS | Status: DC | PRN
  Administered 2017-03-28: 12:00:00 via INTRAVENOUS

## 2017-03-28 SURGICAL SUPPLY — 33 items
BARRIER ADHS 3X4 INTERCEED (GAUZE/BANDAGES/DRESSINGS) IMPLANT
CHLORAPREP W/TINT 26ML (MISCELLANEOUS) ×3 IMPLANT
CLAMP CORD UMBIL (MISCELLANEOUS) IMPLANT
CLOTH BEACON ORANGE TIMEOUT ST (SAFETY) ×3 IMPLANT
DRSG OPSITE POSTOP 4X10 (GAUZE/BANDAGES/DRESSINGS) ×3 IMPLANT
ELECT REM PT RETURN 9FT ADLT (ELECTROSURGICAL) ×3
ELECTRODE REM PT RTRN 9FT ADLT (ELECTROSURGICAL) ×1 IMPLANT
EXTRACTOR VACUUM KIWI (MISCELLANEOUS) IMPLANT
GAUZE SPONGE 4X4 12PLY STRL LF (GAUZE/BANDAGES/DRESSINGS) ×6 IMPLANT
GAUZE SPONGE 4X4 3PLY NS LF (GAUZE/BANDAGES/DRESSINGS) ×3 IMPLANT
GLOVE BIO SURGEON STRL SZ 6.5 (GLOVE) ×2 IMPLANT
GLOVE BIO SURGEONS STRL SZ 6.5 (GLOVE) ×1
GLOVE BIOGEL PI IND STRL 7.0 (GLOVE) ×2 IMPLANT
GLOVE BIOGEL PI INDICATOR 7.0 (GLOVE) ×4
GOWN STRL REUS W/TWL LRG LVL3 (GOWN DISPOSABLE) ×6 IMPLANT
KIT ABG SYR 3ML LUER SLIP (SYRINGE) IMPLANT
NEEDLE HYPO 22GX1.5 SAFETY (NEEDLE) ×3 IMPLANT
NEEDLE HYPO 25X5/8 SAFETYGLIDE (NEEDLE) IMPLANT
NS IRRIG 1000ML POUR BTL (IV SOLUTION) ×3 IMPLANT
PACK C SECTION WH (CUSTOM PROCEDURE TRAY) ×3 IMPLANT
PAD ABD 7.5X8 STRL (GAUZE/BANDAGES/DRESSINGS) ×3 IMPLANT
PAD OB MATERNITY 4.3X12.25 (PERSONAL CARE ITEMS) ×3 IMPLANT
PENCIL SMOKE EVAC W/HOLSTER (ELECTROSURGICAL) ×3 IMPLANT
RETRACTOR WND ALEXIS 25 LRG (MISCELLANEOUS) IMPLANT
RTRCTR WOUND ALEXIS 25CM LRG (MISCELLANEOUS)
SUT VIC AB 0 CT1 36 (SUTURE) ×18 IMPLANT
SUT VIC AB 2-0 CT1 27 (SUTURE) ×2
SUT VIC AB 2-0 CT1 TAPERPNT 27 (SUTURE) ×1 IMPLANT
SUT VIC AB 4-0 PS2 27 (SUTURE) ×3 IMPLANT
SYR CONTROL 10ML LL (SYRINGE) IMPLANT
SYRINGE 10CC LL (SYRINGE) ×3 IMPLANT
TOWEL OR 17X24 6PK STRL BLUE (TOWEL DISPOSABLE) ×3 IMPLANT
TRAY FOLEY BAG SILVER LF 14FR (SET/KITS/TRAYS/PACK) IMPLANT

## 2017-03-28 NOTE — Progress Notes (Signed)
Lack of progress and late decelerations noted, 5 cm dilated. The risks of cesarean section discussed with the patient included but were not limited to: bleeding which may require transfusion or reoperation; infection which may require antibiotics; injury to bowel, bladder, ureters or other surrounding organs; injury to the fetus; need for additional procedures including hysterectomy in the event of a life-threatening hemorrhage; placental abnormalities wth subsequent pregnancies, incisional problems, thromboembolic phenomenon and other postoperative/anesthesia complications. The patient concurred with the proposed plan, giving informed written consent for the procedure.   Patient has been NPO she will remain NPO for procedure. Anesthesia and OR aware. Preoperative prophylactic antibiotics and SCDs ordered on call to the OR.  To OR when ready.  Adam Phenix, MD 03/28/2017

## 2017-03-28 NOTE — Progress Notes (Signed)
Patient ID: Virginia Guerrero, female   DOB: 09-06-1980, 36 y.o.   MRN: [id number]  Feeling strong urge to push; water recently broken by Dr Frances Furbish; epidural placed earlier but not getting relief; on mag sulfate  BP 144/97, P 108 FHR 130s, 10x10accels, no decels Ctx q 2 mins w/ Pit @ 50mu/min Cx now 7/80/0 (was further dilated earlier w/ pressure of BBOW)  IUP@term  TOLAC Pre-e w/ severe features Active labor  Will have anesthesia come to eval; may need to replace epidural Pt positioned on R lat Sims to help w/ dilation/rotation Anticipate VBAC  Clelia Croft, [facility] 03/28/2017 9:53 AM

## 2017-03-28 NOTE — Transfer of Care (Signed)
Immediate Anesthesia Transfer of Care Note  Patient: Virginia Guerrero  Procedure(s) Performed: Procedure(s): CESAREAN SECTION (N/A)  Patient Location: PACU  Anesthesia Type:Epidural  Level of Consciousness: awake  Airway & Oxygen Therapy: Patient Spontanous Breathing  Post-op Assessment: Report given to RN and Post -op Vital signs reviewed and stable  Post vital signs: Reviewed and stable  Last Vitals:  Vitals:   03/28/17 1155 03/28/17 1157  BP: 139/80   Pulse: 82   Resp: 20 18  Temp:    SpO2:      Last Pain:  Vitals:   03/28/17 1135  TempSrc:   PainSc: 8          Complications: No apparent anesthesia complications

## 2017-03-28 NOTE — Lactation Note (Signed)
This note was copied from a baby's chart. Lactation Consultation Note  Patient Name: Virginia Guerrero [id number] Date: 03/28/2017   Initial visit attempted at 6 hours of life, but Mom stated that the infant is feeding well and requested that lactation return tomorrow, instead.  Lurline Hare [facility] 03/28/2017, 7:12 PM

## 2017-03-28 NOTE — Progress Notes (Signed)
Patient Vitals for the past 4 hrs:  BP Temp Temp src Pulse Resp  03/28/17 0700 129/77 - - 74 17  03/28/17 0630 127/87 - - 74 17  03/28/17 0600 139/86 97.9 F (36.6 C) Oral 72 17  03/28/17 0530 130/71 - - 81 -  03/28/17 0500 124/61 - - 83 -  03/28/17 0400 127/88 - - 70 17  03/28/17 0339 131/89 - - 82 17    Foley fell out around 0130.  Now on 14 mu/min pitocin, requesting epidural. cx still 4/50/-3.  FHR Cat 1. MgSO4 @ 2gm/hr. CTX q 3-5 minutews

## 2017-03-28 NOTE — Op Note (Signed)
Cesarean Section Procedure Note   Virginia Guerrero  03/27/2017 - 03/28/2017  Indications: Failed VBAC , fetal intolerance  Pre-operative Diagnosis: fetal intolerance.   Post-operative Diagnosis: Same   Surgeon: Surgeon(s) and Role:    Adam Phenix, MD - Primary   Assistants: none  Anesthesia: epidural   Procedure Details:  The patient was seen in the Holding Room. The risks, benefits, complications, treatment options, and expected outcomes were discussed with the patient. The patient concurred with the proposed plan, giving informed consent. identified as Virginia Guerrero and the procedure verified as C-Section Delivery. A Time Out was held and the above information confirmed.  After induction of anesthesia, the patient was draped and prepped in the usual sterile manner. A transverse was made and carried down through the subcutaneous tissue to the fascia. Fascial incision was made and extended transversely. The fascia was separated from the underlying rectus tissue superiorly and inferiorly. The peritoneum was identified and entered. Peritoneal incision was extended longitudinally. The utero-vesical peritoneal reflection was incised transversely and the bladder flap was bluntly freed from the lower uterine segment. A low transverse uterine incision was made. Delivered from cephalic presentation was a  Living newborn infant(s) or Female with Apgar scores of 8 at one minute and 9 at five minutes. Cord ph was not sent the umbilical cord was clamped and cut cord blood was obtained for evaluation. The placenta was removed Intact and appeared normal. The uterine outline, tubes and ovaries appeared normal}. The uterine incision was closed with running locked sutures of 0Vicryl and an imbricating layer followed. Hemostasis was observed. Lavage was carried out until clear. The peritoneum was closed with 2-0 VicrylThe fascia was then reapproximated with running sutures of 0Vicryl. The skin was closed with  4-0Vicryl.   Instrument, sponge, and needle counts were correct prior the abdominal closure and were correct at the conclusion of the case.    Findings:   Estimated Blood Loss: 600 ml  Total IV Fluids:   Urine Output: 100CC OF clear urine  Complications: no complications  Disposition: PACU - hemodynamically stable.   Maternal Condition: stable   Baby condition / location:  Couplet care / Skin to Skin  Attending Attestation: I was present and scrubbed for the entire procedure.   Signed: Surgeon(s): Adam Phenix, MD

## 2017-03-28 NOTE — Anesthesia Procedure Notes (Signed)
Epidural Patient location during procedure: OB  Staffing Anesthesiologist: Phillips Grout Performed: anesthesiologist   Preanesthetic Checklist Completed: patient identified, site marked, surgical consent, pre-op evaluation, timeout performed, IV checked, risks and benefits discussed and monitors and equipment checked  Epidural Patient position: sitting Prep: DuraPrep Patient monitoring: heart rate, continuous pulse ox and blood pressure Approach: left paramedian Location: L3-L4 Injection technique: LOR saline  Needle:  Needle type: Tuohy  Needle gauge: 17 G Needle length: 9 cm and 9 Needle insertion depth: 6 cm Catheter type: closed end flexible Catheter size: 20 Guage Catheter at skin depth: 10 cm Test dose: negative  Assessment Events: blood not aspirated, injection not painful, no injection resistance, negative IV test and no paresthesia  Additional Notes Patient identified. Risks/Benefits/Options discussed with patient including but not limited to bleeding, infection, nerve damage, paralysis, failed block, incomplete pain control, headache, blood pressure changes, nausea, vomiting, reactions to medication both or allergic, itching and postpartum back pain. Confirmed with bedside nurse the patient's most recent platelet count. Confirmed with patient that they are not currently taking any anticoagulation, have any bleeding history or any family history of bleeding disorders. Patient expressed understanding and wished to proceed. All questions were answered. Sterile technique was used throughout the entire procedure. Please see nursing notes for vital signs. Test dose was given through epidural needle and negative prior to continuing to dose epidural or start infusion. Warning signs of high block given to the patient including shortness of breath, tingling/numbness in hands, complete motor block, or any concerning symptoms with instructions to call for help. Patient was given  instructions on fall risk and not to get out of bed. All questions and concerns addressed with instructions to call with any issues.

## 2017-03-28 NOTE — Progress Notes (Signed)
Patient Vitals for the past 4 hrs:  BP Temp Temp src Pulse Resp  03/27/17 2300 (!) 121/94 - - 78 18  03/27/17 2230 123/77 - - 86 18  03/27/17 2200 (!) 146/77 - - 90 18  03/27/17 2135 140/81 (!) 97.5 F (36.4 C) Oral 84 18    Foley still in. FHR Cat 1.  Still having some mild ctx.  MgSO4 @ 2gm/hr.  WHen foley falls out will restart pitocin.

## 2017-03-28 NOTE — Progress Notes (Signed)
Patient ID: Virginia Guerrero, female   DOB: 03/03/1981, 36 y.o.   MRN: [id number]  CTSP for recurrent variables with ctx down to 70-100s. Pt had epidural redone but now feeling increased urge to push. FHR baseline 140s, +LTV. Ctx q 2-3 mins w/ Pit @ 52mu/min. Cx 5/70/-1. IUPC and IFSE placed. Will start amnioinfusion and also reposition to help w/ variables. Guarded for vag del now.  Will keep MVUs adequate and recheck after 2hrs. Anesthesia to be updated re pt's strong pressure and unbearable urge to push down.  Cam Hai CNM 03/28/2017

## 2017-03-28 NOTE — Anesthesia Preprocedure Evaluation (Signed)
Anesthesia Evaluation  Patient identified by MRN, date of birth, ID band Patient awake    Reviewed: Allergy & Precautions, NPO status , Patient's Chart, lab work & pertinent test results  Airway Mallampati: II  TM Distance: >3 FB Neck ROM: Full    Dental no notable dental hx.    Pulmonary neg pulmonary ROS,    Pulmonary exam normal breath sounds clear to auscultation       Cardiovascular hypertension, Normal cardiovascular exam Rhythm:Regular Rate:Normal     Neuro/Psych negative neurological ROS  negative psych ROS   GI/Hepatic negative GI ROS, Neg liver ROS,   Endo/Other  negative endocrine ROS  Renal/GU negative Renal ROS  negative genitourinary   Musculoskeletal negative musculoskeletal ROS (+)   Abdominal   Peds negative pediatric ROS (+)  Hematology negative hematology ROS (+)   Anesthesia Other Findings   Reproductive/Obstetrics (+) Pregnancy                             Anesthesia Physical Anesthesia Plan  ASA: II  Anesthesia Plan: Epidural   Post-op Pain Management:    Induction:   PONV Risk Score and Plan:   Airway Management Planned:   Additional Equipment:   Intra-op Plan:   Post-operative Plan:   Informed Consent: I have reviewed the patients History and Physical, chart, labs and discussed the procedure including the risks, benefits and alternatives for the proposed anesthesia with the patient or authorized representative who has indicated his/her understanding and acceptance.     Plan Discussed with:   Anesthesia Plan Comments:         Anesthesia Quick Evaluation

## 2017-03-29 LAB — CBC
HCT: 26.2 % — ABNORMAL LOW (ref 36.0–46.0)
Hemoglobin: 8.8 g/dL — ABNORMAL LOW (ref 12.0–15.0)
MCH: 27.1 pg (ref 26.0–34.0)
MCHC: 33.6 g/dL (ref 30.0–36.0)
MCV: 80.6 fL (ref 78.0–100.0)
PLATELETS: 196 10*3/uL (ref 150–400)
RBC: 3.25 MIL/uL — ABNORMAL LOW (ref 3.87–5.11)
RDW: 15.7 % — AB (ref 11.5–15.5)
WBC: 15.7 10*3/uL — AB (ref 4.0–10.5)

## 2017-03-29 NOTE — Progress Notes (Signed)
Subjective: Postpartum Day 1: Cesarean Delivery Patient reports incisional pain and tolerating PO.    Objective: Vital signs in last 24 hours: Temp:  [97.3 F (36.3 C)-99.7 F (37.6 C)] 97.5 F (36.4 C) (09/29 0623) Pulse Rate:  [73-120] 73 (09/29 0623) Resp:  [16-25] 18 (09/29 0623) BP: (104-175)/(47-107) 129/75 (09/29 0623) SpO2:  [97 %-100 %] 100 % (09/29 0623) Weight:  [92.1 kg (203 lb)] 92.1 kg (203 lb) (09/28 1515)  Physical Exam:  General: alert, cooperative and no distress Lochia: appropriate Uterine Fundus: firm Incision: dressing dry and intact DVT Evaluation: No evidence of DVT seen on physical exam.   Recent Labs  03/28/17 1402 03/29/17 0543  HGB 10.4* 8.8*  HCT 31.4* 26.2*    Assessment/Plan: Status post Cesarean section. Doing well postoperatively.  D/C MgSO4 at 1300.  Scheryl Darter 03/29/2017, 6:45 AM

## 2017-03-29 NOTE — Anesthesia Postprocedure Evaluation (Signed)
Anesthesia Post Note  Patient: Virginia Guerrero  Procedure(s) Performed: Procedure(s) (LRB): CESAREAN SECTION (N/A)     Patient location during evaluation: [facility] Anesthesia Type: Epidural Level of consciousness: awake and alert and oriented Pain management: satisfactory to patient Vital Signs Assessment: post-procedure vital signs reviewed and stable Respiratory status: respiratory function stable Cardiovascular status: stable Postop Assessment: no headache, no backache, epidural receding, patient able to bend at knees, no signs of nausea or vomiting and adequate PO intake Anesthetic complications: no    Last Vitals:  Vitals:   03/29/17 0700 03/29/17 0826  BP:  139/70  Pulse:  73  Resp: 18 18  Temp:  36.9 C  SpO2:  100%    Last Pain:  Vitals:   03/29/17 0826  TempSrc: Oral  PainSc:    Pain Goal:                 [name]

## 2017-03-29 NOTE — Lactation Note (Signed)
This note was copied from a baby's chart. Lactation Consultation Note  Patient Name: Virginia Guerrero [id number] Date: 03/29/2017 Reason for consult: Follow-up assessment;Early term 23-38.6wks  Visited with Mom, baby 40 hrs old.  Mom requesting assistance with positioning and latch saying that her nipples are sore.  Both nipples have small blister on tip, right breast slightly abraded.  Hand expression reviewed, and encouraged Mom to do this often.  Unable to express any colostrum at present.   Assisted with latching baby in football hold on both breasts.  Mom needing guidance with breast support, and supporting baby's head.  Basics reviewed.  Baby able to attain a deep areolar latch, gently pulled down on baby's chin to open his mouth wider and flange his lips.  Mom amazed how much more comfortable it felt.  Mom stated she was feeling uterine cramping.  Baby very rhythmic, and Mom taught how to do alternate breast compression to increase milk transfer. A few swallows audible. Recommended post breast feeding double pumping on initiation setting.  Mom to call for her nurse for assistance in feeding baby any EBM.  Talked about spoon feeding, or slow flow paced bottle feeding.   Plan- 1- Breastfeed baby STS, at least every 3 hrs.  Ask for assistance making sure baby is latched deeply onto breast 2- offer both breasts at each feeding 3- pump both breasts 15 mins on initiation setting to support milk supply.  Hand expression and breast massage also. 4- Keep baby STS as much as possible, avoid pacifier. 5- call for assistance as needed.  Lactation Tools Discussed/Used Tools: Pump Breast pump type: Double-Electric Breast Pump Pump Review: Setup, frequency, and cleaning;Milk Storage Initiated by:: Johny Blamer RN IBCLC Date initiated:: 03/29/17   Consult Status Consult Status: Follow-up Date: 03/30/17 Follow-up type: In-patient    Virginia Guerrero 03/29/2017, 1:39 PM

## 2017-03-30 ENCOUNTER — Encounter (HOSPITAL_COMMUNITY): Payer: Self-pay | Admitting: Obstetrics & Gynecology

## 2017-03-30 NOTE — Progress Notes (Signed)
Subjective: Postpartum Day 2: Cesarean Delivery Failed TOLAC [employer] [facility] [employer] [facility] Patient reports no problems this morning.   Ambulating and voiding without problems. Tolerating diet. Pain controlled. No HA or visual changes Breat feeding. Objective: Vital signs in last 24 hours: Temp:  [97.5 F (36.4 C)-98.9 F (37.2 C)] 98.8 F (37.1 C) (09/30 0404) Pulse Rate:  [73-88] 88 (09/30 0404) Resp:  [16-18] 18 (09/30 0404) BP: (118-141)/(66-85) 123/77 (09/30 0404) SpO2:  [99 %-100 %] 99 % (09/29 2030)  Physical Exam:  General: no distress Lochia: appropriate Uterine Fundus: firm Incision: healing well DVT Evaluation: No evidence of DVT seen on physical exam.   Recent Labs  03/28/17 1402 03/29/17 0543  HGB 10.4* 8.8*  HCT 31.4* 26.2*    Assessment/Plan: Status post Cesarean section. Doing well postoperatively. Off Magnesium. BP stable without meds. Continue current care. Discharge tomorrow.  Hermina Staggers 03/30/2017, 6:20 AM

## 2017-03-30 NOTE — Progress Notes (Signed)
CSW received consult for hx of Anxiety and Depression.  CSW met with MOB to offer support and complete assessment.   MOB introduced her visitors as FOB/Josh and her cousin.  She gave consent to speak openly with them present.  MOB reports that baby "Jru" is doing well and that labor and delivery was "rough" and ended in a c-section.  She states she is glad that baby is here and healthy.  She reports that she has 2 daughters at home, ages 58 and 63.  FOB is father of 48 year old as well.  She states she has a good support system and everything she needs for baby at home.  Parents are aware of SIDS precautions as reviewed by CSW and report that baby has a bassinet to sleep in at home.   MOB reports that she has felt well emotionally throughout this pregnancy, however, endorses symptoms of Anxiety most of her life.  She states her symptoms do not interfere with daily life and that she is easily able to control them with coping strategies such as listening to music and mindfulness/refocusing.  She reports feeling no need for mental health treatment at this time, but was open to education and resources provided by CSW. CSW provided education regarding the baby blues period vs. perinatal mood disorders, discussed treatment and gave resources for mental health follow up if concerns arise.  CSW recommends self-evaluation during the postpartum time period using the New Mom Checklist from Postpartum Progress and encouraged MOB to contact a medical professional if symptoms are noted at any time.  CSW spoke about the prevalence of PMADs in fathers as well and encouraged support of each other and open communication. Bonding is evident in the way MOB tended to and provided affection to baby while CSW was in the room.   CSW identifies no further need for intervention and no barriers to discharge at this time.

## 2017-03-31 MED ORDER — IBUPROFEN 600 MG PO TABS: 600.0000 mg | ORAL_TABLET | Freq: Four times a day (QID) | ORAL | 2 refills | Status: DC | PRN

## 2017-03-31 MED ORDER — AMLODIPINE BESYLATE 5 MG PO TABS
5.0000 mg | ORAL_TABLET | Freq: Every day | ORAL | Status: DC
  Administered 2017-03-31: 5 mg via ORAL
  Filled 2017-03-31: qty 1

## 2017-03-31 MED ORDER — FERROUS SULFATE 325 (65 FE) MG PO TABS
325.0000 mg | ORAL_TABLET | Freq: Two times a day (BID) | ORAL | 1 refills | Status: DC
Start: 2017-03-31 — End: 2019-01-24

## 2017-03-31 MED ORDER — OXYCODONE-ACETAMINOPHEN 5-325 MG PO TABS: 1.0000 | ORAL_TABLET | Freq: Four times a day (QID) | ORAL | 0 refills | Status: DC | PRN

## 2017-03-31 MED ORDER — DOCUSATE SODIUM 100 MG PO CAPS: 100.0000 mg | ORAL_CAPSULE | Freq: Two times a day (BID) | ORAL | 2 refills | Status: DC | PRN

## 2017-03-31 MED ORDER — AMLODIPINE BESYLATE 5 MG PO TABS: 5.0000 mg | ORAL_TABLET | Freq: Every day | ORAL | 2 refills | Status: DC

## 2017-03-31 NOTE — Discharge Instructions (Signed)
Cesarean Delivery, Care After °Refer to this sheet in the next few weeks. These instructions provide you with information about caring for yourself after your procedure. Your health care provider may also give you more specific instructions. Your treatment has been planned according to current medical practices, but problems sometimes occur. Call your health care provider if you have any problems or questions after your procedure. °What can I expect after the procedure? °After the procedure, it is common to have: °· A small amount of blood or clear fluid coming from the incision. °· Some redness, swelling, and pain in your incision area. °· Some abdominal pain and soreness. °· Vaginal bleeding (lochia). °· Pelvic cramps. °· Fatigue. ° °Follow these instructions at home: °Incision care ° °· Follow instructions from your health care provider about how to take care of your incision. Make sure you: °? Wash your hands with soap and water before you change your bandage (dressing). If soap and water are not available, use hand sanitizer. °? Change your dressing as told by your health care provider. °? Leave stitches (sutures), skin staples, skin glue, or adhesive strips in place. These skin closures may need to stay in place for 2 weeks or longer. If adhesive strip edges start to loosen and curl up, you may trim the loose edges. Do not remove adhesive strips completely unless your health care provider tells you to do that. °· Check your incision area every day for signs of infection. Check for: °? More redness, swelling, or pain. °? More fluid or blood. °? Warmth. °? Pus or a bad smell. °· When you cough or sneeze, hug a pillow. This helps with pain and decreases the chance of your incision opening up (dehiscing). Do this until your incision heals. °Medicines °· Take over-the-counter and prescription medicines only as told by your health care provider. °· If you were prescribed an antibiotic medicine, take it as told by  your health care provider. Do not stop taking the antibiotic until it is finished. °Driving °· Do not drive or operate heavy machinery while taking prescription pain medicine. °· Do not drive for 24 hours if you received a sedative. °Lifestyle °· Do not drink alcohol. This is especially important if you are breastfeeding or taking pain medicine. °· Do not use tobacco products, including cigarettes, chewing tobacco, or e-cigarettes. If you need help quitting, ask your health care provider. Tobacco can delay wound healing. °Eating and drinking °· Drink at least 8 eight-ounce glasses of water every day unless told not to by your health care provider. If you breastfeed, you may need to drink more water than this. °· Eat high-fiber foods every day. These foods may help prevent or relieve constipation. High-fiber foods include: °? Whole grain cereals and breads. °? Brown rice. °? Beans. °? Fresh fruits and vegetables. °Activity °· Return to your normal activities as told by your health care provider. Ask your health care provider what activities are safe for you. °· Rest as much as possible. Try to rest or take a nap while your baby is sleeping. °· Do not lift anything that is heavier than your baby or 10 lb (4.5 kg) as told by your health care provider. °· Ask your health care provider when you can engage in sexual activity. This may depend on your: °? Risk of infection. °? Healing rate. °? Comfort and desire to engage in sexual activity. °Bathing °· Do not take baths, swim, or use a hot tub until your health care   provider approves. Ask your health care provider if you can take showers. You may only be allowed to take sponge baths until your incision heals. °· Keep your dressing dry as told by your health care provider. °General instructions °· Do not use tampons or douches until your health care provider approves. °· Wear: °? Loose, comfortable clothing. °? A supportive and well-fitting bra. °· Watch for any blood clots  that may pass from your vagina. These may look like clumps of dark red, brown, or black discharge. °· Keep your perineum clean and dry as told by your health care provider. °· Wipe from front to back when you use the toilet. °· If possible, have someone help you care for your baby and help with household activities for a few days after you leave the hospital. °· Keep all follow-up visits for you and your baby as told by your health care provider. This is important. °Contact a health care provider if: °· You have: °? Bad-smelling vaginal discharge. °? Difficulty urinating. °? Pain when urinating. °? A sudden increase or decrease in the frequency of your bowel movements. °? More redness, swelling, or pain around your incision. °? More fluid or blood coming from your incision. °? Pus or a bad smell coming from your incision. °? A fever. °? A rash. °? Little or no interest in activities you used to enjoy. °? Questions about caring for yourself or your baby. °? Nausea. °· Your incision feels warm to the touch. °· Your breasts turn red or become painful or hard. °· You feel unusually sad or worried. °· You vomit. °· You pass large blood clots from your vagina. If you pass a blood clot, save it to show to your health care provider. Do not flush blood clots down the toilet without showing your health care provider. °· You urinate more than usual. °· You are dizzy or light-headed. °· You have not breastfed and have not had a menstrual period for 12 weeks after delivery. °· You stopped breastfeeding and have not had a menstrual period for 12 weeks after stopping breastfeeding. °Get help right away if: °· You have: °? Pain that does not go away or get better with medicine. °? Chest pain. °? Difficulty breathing. °? Blurred vision or spots in your vision. °? Thoughts about hurting yourself or your baby. °? New pain in your abdomen or in one of your legs. °? A severe headache. °· You faint. °· You bleed from your vagina so much  that you fill two sanitary pads in one hour. °This information is not intended to replace advice given to you by your health care provider. Make sure you discuss any questions you have with your health care provider. °Document Released: 03/09/2002 Document Revised: 10/26/2015 Document Reviewed: 05/22/2015 °Elsevier Interactive Patient Education © 2017 [employer] ° ° ° °Preeclampsia and Eclampsia °Preeclampsia is a serious condition that develops only during pregnancy. It is also called toxemia of pregnancy. This condition causes high blood pressure along with other symptoms, such as swelling and headaches. These symptoms may develop as the condition gets worse. Preeclampsia may occur at 20 weeks of pregnancy or later. °Diagnosing and treating preeclampsia early is very important. If not treated early, it can cause serious problems for you and your baby. One problem it can lead to is eclampsia, which is a condition that causes muscle jerking or shaking (convulsions or seizures) in the mother. Delivering your baby is the best treatment for preeclampsia or eclampsia. Preeclampsia   and eclampsia symptoms usually go away after your baby is born. °What are the causes? °The cause of preeclampsia is not known. °What increases the risk? °The following risk factors make you more likely to develop preeclampsia: °· Being pregnant for the first time. °· Having had preeclampsia during a past pregnancy. °· Having a family history of preeclampsia. °· Having high blood pressure. °· Being pregnant with twins or triplets. °· Being 35 or older. °· Being African-American. °· Having kidney disease or diabetes. °· Having medical conditions such as lupus or blood diseases. °· Being very overweight (obese). ° °What are the signs or symptoms? °The earliest signs of preeclampsia are: °· High blood pressure. °· Increased protein in your urine. Your health care provider will check for this at every visit before you give birth (prenatal  visit). ° °Other symptoms that may develop as the condition gets worse include: °· Severe headaches. °· Sudden weight gain. °· Swelling of the hands, face, legs, and feet. °· Nausea and vomiting. °· Vision problems, such as blurred or double vision. °· Numbness in the face, arms, legs, and feet. °· Urinating less than usual. °· Dizziness. °· Slurred speech. °· Abdominal pain, especially upper abdominal pain. °· Convulsions or seizures. ° °Symptoms generally go away after giving birth. °How is this diagnosed? °There are no screening tests for preeclampsia. Your health care provider will ask you about symptoms and check for signs of preeclampsia during your prenatal visits. You may also have tests that include: °· Urine tests. °· Blood tests. °· Checking your blood pressure. °· Monitoring your baby’s heart rate. °· Ultrasound. ° °How is this treated? °You and your health care provider will determine the treatment approach that is best for you. Treatment may include: °· Having more frequent prenatal exams to check for signs of preeclampsia, if you have an increased risk for preeclampsia. °· Bed rest. °· Reducing how much salt (sodium) you eat. °· Medicine to lower your blood pressure. °· Staying in the hospital, if your condition is severe. There, treatment will focus on controlling your blood pressure and the amount of fluids in your body (fluid retention). °· You may need to take medicine (magnesium sulfate) to prevent seizures. This medicine may be given as an injection or through an IV tube. °· Delivering your baby early, if your condition gets worse. You may have your labor started with medicine (induced), or you may have a cesarean delivery. ° °Follow these instructions at home: °Eating and drinking ° °· Drink enough fluid to keep your urine clear or pale yellow. °· Eat a healthy diet that is low in sodium. Do not add salt to your food. Check nutrition labels to see how much sodium a food or beverage  contains. °· Avoid caffeine. °Lifestyle °· Do not use any products that contain nicotine or tobacco, such as cigarettes and e-cigarettes. If you need help quitting, ask your health care provider. °· Do not use alcohol or drugs. °· Avoid stress as much as possible. Rest and get plenty of sleep. °General instructions °· Take over-the-counter and prescription medicines only as told by your health care provider. °· When lying down, lie on your side. This keeps pressure off of your baby. °· When sitting or lying down, raise (elevate) your feet. Try putting some pillows underneath your lower legs. °· Exercise regularly. Ask your health care provider what kinds of exercise are best for you. °· Keep all follow-up and prenatal visits as told by your health care provider.   This is important. °How is this prevented? °To prevent preeclampsia or eclampsia from developing during another pregnancy: °· Get proper medical care during pregnancy. Your health care provider may be able to prevent preeclampsia or diagnose and treat it early. °· Your health care provider may have you take a low-dose aspirin or a calcium supplement during your next pregnancy. °· You may have tests of your blood pressure and kidney function after giving birth. °· Maintain a healthy weight. Ask your health care provider for help managing weight gain during pregnancy. °· Work with your health care provider to manage any long-term (chronic) health conditions you have, such as diabetes or kidney problems. ° °Contact a health care provider if: °· You gain more weight than expected. °· You have headaches. °· You have nausea or vomiting. °· You have abdominal pain. °· You feel dizzy or light-headed. °Get help right away if: °· You develop sudden or severe swelling anywhere in your body. This usually happens in the legs. °· You gain 5 lbs (2.3 kg) or more during one week. °· You have severe: °? Abdominal pain. °? Headaches. °? Dizziness. °? Vision  problems. °? Confusion. °? Nausea or vomiting. °· You have a seizure. °· You have trouble moving any part of your body. °· You develop numbness in any part of your body. °· You have trouble speaking. °· You have any abnormal bleeding. °· You pass out. °This information is not intended to replace advice given to you by your health care provider. Make sure you discuss any questions you have with your health care provider. °Document Released: 06/14/2000 Document Revised: 02/13/2016 Document Reviewed: 01/22/2016 °Elsevier Interactive Patient Education © 2018 Else[employer] Inc. ° °

## 2017-03-31 NOTE — Lactation Note (Addendum)
This note was copied from a baby's chart. Lactation Consultation Note  Patient Name: Virginia Guerrero [id number] Date: 03/31/2017 Reason for consult: Follow-up assessment   Baby 72 hours old and being discharged. Reviewed engorgement care and monitoring voids/stools.  Plan: Mother will breastfeed baby 8-12 times/24 hours and with feeding cues at least q 3 hours. Then mother will post pump with either manual or DEBP for 10-20 min. Give baby back volume back.  Mother is also supplementing with formula. Mother has ordered her insurance pump but will use manual pump until it arrives. No questions or concerns at this time. Provided mother with comfort gels. Mother is wearing shells for soreness.  During consult baby starting cueing. Mother easily expressed drops of breastmilk. Mother latched baby in cradle hold.  Sucks and swallows observed.    Maternal Data    Feeding Feeding Type: Breast Fed Length of feed: 30 min  LATCH Score                   Interventions    Lactation Tools Discussed/Used     Consult Status Consult Status: Complete    Hardie Pulley 03/31/2017, 1:20 PM

## 2017-03-31 NOTE — Discharge Summary (Signed)
OB Discharge Summary     Patient Name: Virginia Guerrero DOB: 10-18-1980 MRN: [id number]  Date of admission: 03/27/2017 Delivering MD: Adam Phenix   Date of discharge: 03/31/2017  Admitting diagnosis: INDUCTION Intrauterine pregnancy: [redacted]w[redacted]d     Secondary diagnosis:  Principal Problem:   Hypertension in pregnancy, preeclampsia, severe, delivered Active Problems:   Postpartum anemia   S/P cesarean section  Additional problems: None     Discharge diagnosis: Term Pregnancy Delivered and Preeclampsia (severe)                                                                                                Post partum procedures:Magnesium sulfate for eclampsia prophylaxis  Augmentation: AROM, Pitocin, Cytotec and Foley Balloon  Complications: None  Hospital course:  Induction of Labor With Cesarean Section  36 y.o. yo [phone] at [redacted]w[redacted]d was admitted to the hospital 03/27/2017 for induction of labor. Patient had a labor course significant for severe preeclampsia. The patient went for cesarean section due to Non-Reassuring FHR, and delivered a Viable infant at 9:25 AM ,03/28/2017 . Details of operation can be found in separate operative note.  Patient had an uncomplicated postpartum course. Received magnesium sulfate for 24 hours postpartum. BP was stable, started on norvasc 5 mg daily. She is ambulating, tolerating a regular diet, passing flatus, and urinating well.  Patient is discharged home in stable condition on 03/31/17.                                    Physical exam  Vitals:   03/30/17 0800 03/30/17 1200 03/30/17 2005 03/31/17 0930  BP: 138/85 (!) 128/92 137/85 133/90  Pulse: 87 87 89 86  Resp: Temp: 98.7 F (37.1 C) 98.5 F (36.9 C) 99.1 F (37.3 C) 98.9 F (37.2 C)  TempSrc: Oral Oral Oral Oral  SpO2: 99% 99% 100% 100%  Weight:      Height:       General: alert, cooperative and no distress Lochia: appropriate Uterine Fundus: firm Incision: Healing well  with no significant drainage, No significant erythema DVT Evaluation: No evidence of DVT seen on physical exam. Negative Homan's sign. No cords or calf tenderness. Labs: Lab Results  Component Value Date   WBC 15.7 (H) 03/29/2017   HGB 8.8 (L) 03/29/2017   HCT 26.2 (L) 03/29/2017   MCV 80.6 03/29/2017   PLT 196 03/29/2017   CMP Latest Ref Rng & Units 03/27/2017  Glucose 65 - 99 mg/dL 82  BUN 6 - 20 mg/dL 6  Creatinine 1.95 - 0.93 mg/dL 2.67  Sodium 124 - 580 mmol/L 136  Potassium 3.5 - 5.1 mmol/L 3.7  Chloride 101 - 111 mmol/L 105  CO2 22 - 32 mmol/L 20(L)  Calcium 8.9 - 10.3 mg/dL 8.9  Total Protein 6.5 - 8.1 g/dL 7.2  Total Bilirubin 0.3 - 1.2 mg/dL 0.4  Alkaline Phos 38 - 126 U/L 149(H)  AST 15 - 41 U/L 23  ALT 14 - 54 U/L 15  Discharge instruction: per After Visit Summary and "Baby and Me Booklet".  After visit meds:  Allergies as of 03/31/2017      Reactions   Latex Itching   condoms      Medication List    TAKE these medications   amLODipine 5 MG tablet Commonly known as:  NORVASC Take 1 tablet (5 mg total) by mouth daily.   docusate sodium 100 MG capsule Commonly known as:  COLACE Take 1 capsule (100 mg total) by mouth 2 (two) times daily as needed.   ferrous sulfate 325 (65 FE) MG tablet Commonly known as:  FERROUSUL Take 1 tablet (325 mg total) by mouth 2 (two) times daily.   ibuprofen 600 MG tablet Commonly known as:  ADVIL,MOTRIN Take 1 tablet (600 mg total) by mouth every 6 (six) hours as needed for moderate pain.   multivitamin-prenatal 27-0.8 MG Tabs tablet Take 1 tablet by mouth daily at 12 noon.   oxyCODONE-acetaminophen 5-325 MG tablet Commonly known as:  PERCOCET/ROXICET Take 1-2 tablets by mouth every 6 (six) hours as needed.       Diet: routine diet  Activity: Advance as tolerated. Pelvic rest for 6 weeks.   Future Appointments Date Time Provider Department Center  04/07/2017 11:15 AM [facility] NURSE [facility]  05/05/2017  4:00 PM Marny Lowenstein, PA-C [facility]   Follow up Visit:No Follow-up on file.  Postpartum contraception: IUD Mirena  Newborn Data: Live born female  Birth Weight: 6 lb 3.3 oz (2815 g) APGAR: 8, 9  Baby Feeding: Breast Disposition:home with mother   03/31/2017 Jaynie Collins, MD

## 2017-03-31 NOTE — Progress Notes (Signed)
Discharge teaching complete. Pt understood all information and did not have any questions. Pt discharged home to family. 

## 2017-04-07 ENCOUNTER — Ambulatory Visit: Payer: Self-pay

## 2017-04-12 IMAGING — US US OB TRANSVAGINAL
1 series · 14 of 21 positions shown · non-contrast
Comparison: None.

CLINICAL DATA: Left lower quadrant pain.

EXAM:
OBSTETRIC <14 WK US AND TRANSVAGINAL OB US
TECHNIQUE: Both transabdominal and transvaginal ultrasound examinations were
performed for complete evaluation of the gestation as well as the
maternal uterus, adnexal regions, and pelvic cul-de-sac.
Transvaginal technique was performed to assess early pregnancy.

[Series 1: us ob transvaginal · 0.17mm/px · 21 acquisitions, 14 frames shown]
[im 1/21]
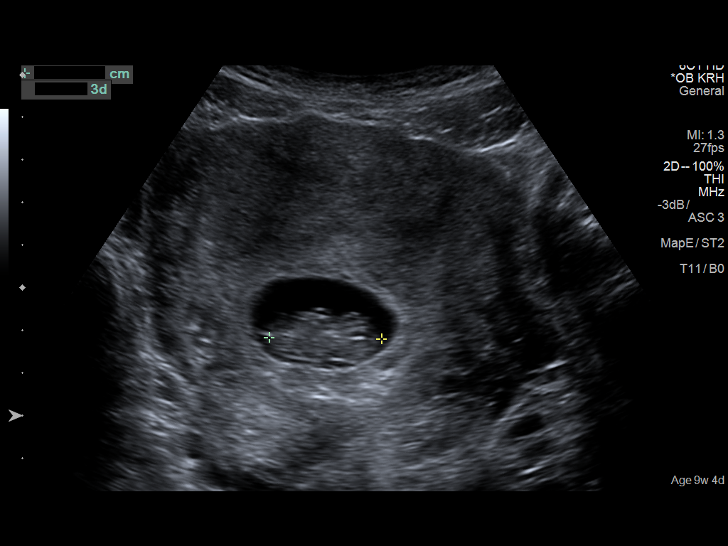
[im 3/21]
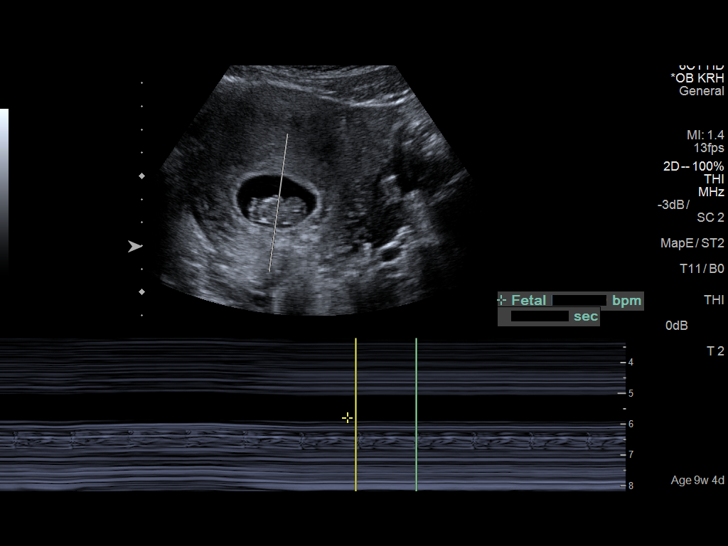
[im 4/21]
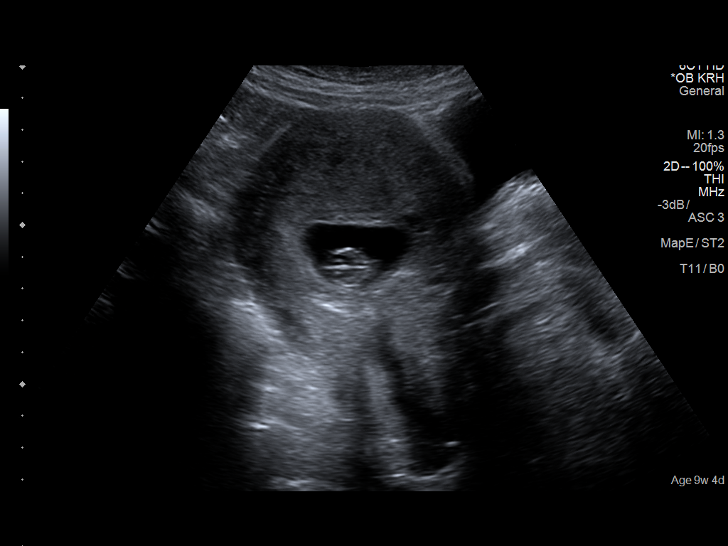
[im 6/21]
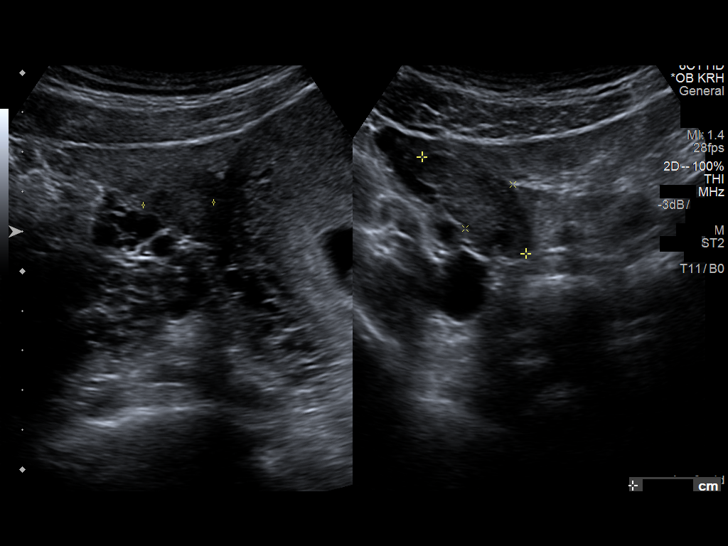
[im 7/21]
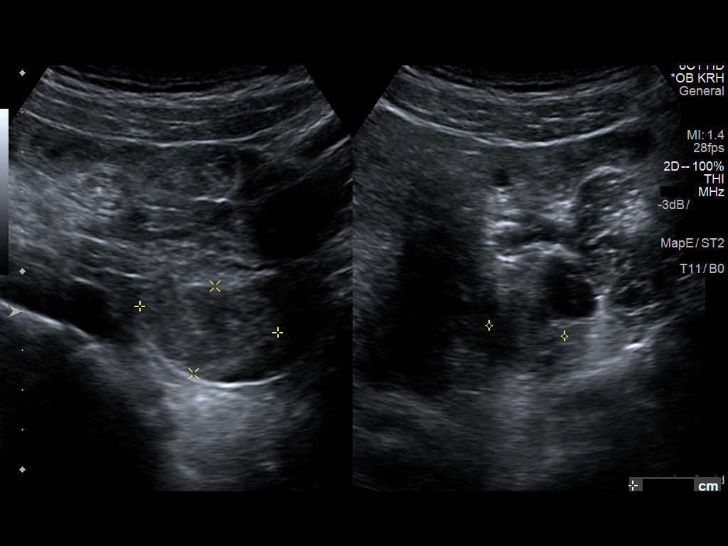
[im 9/21]
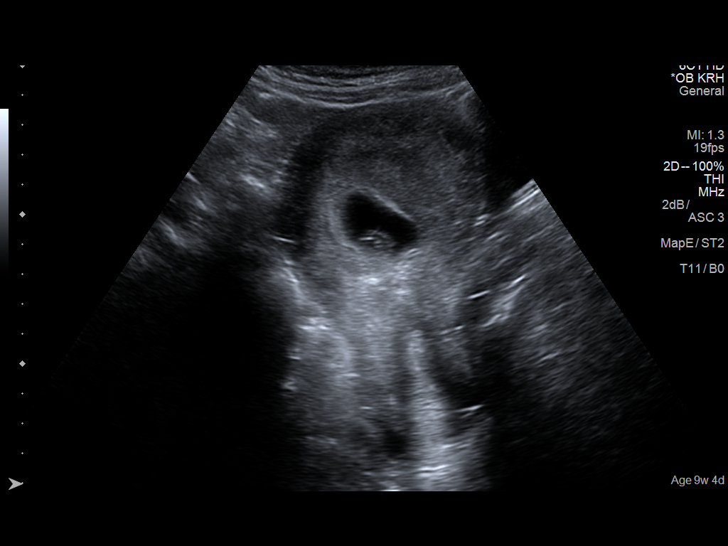
[im 10/21]
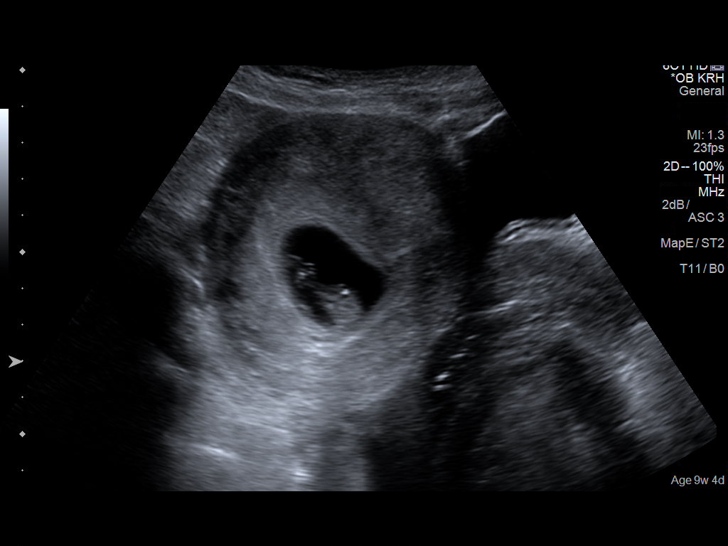
[im 12/21]
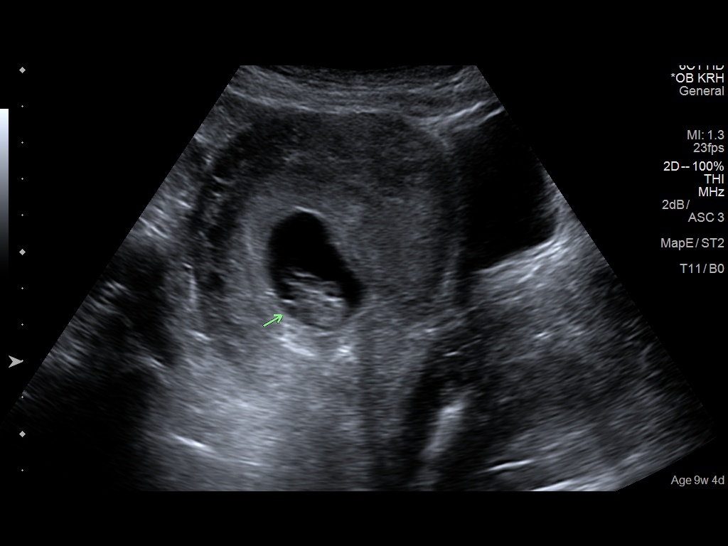
[im 13/21]
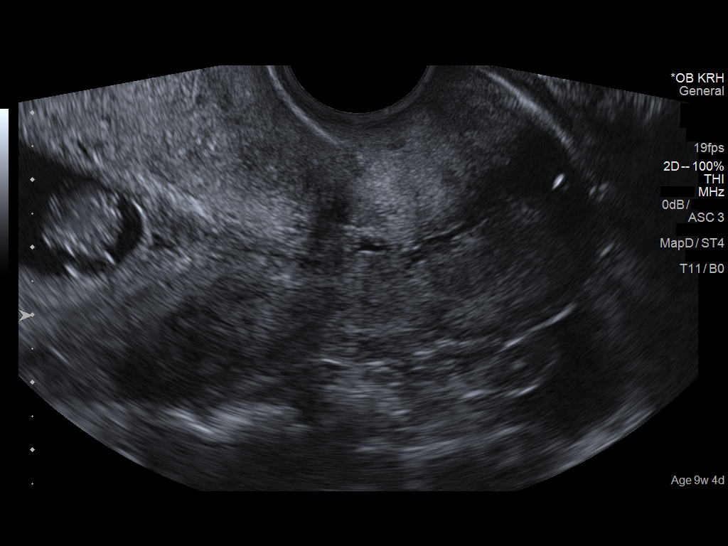
[im 15/21]
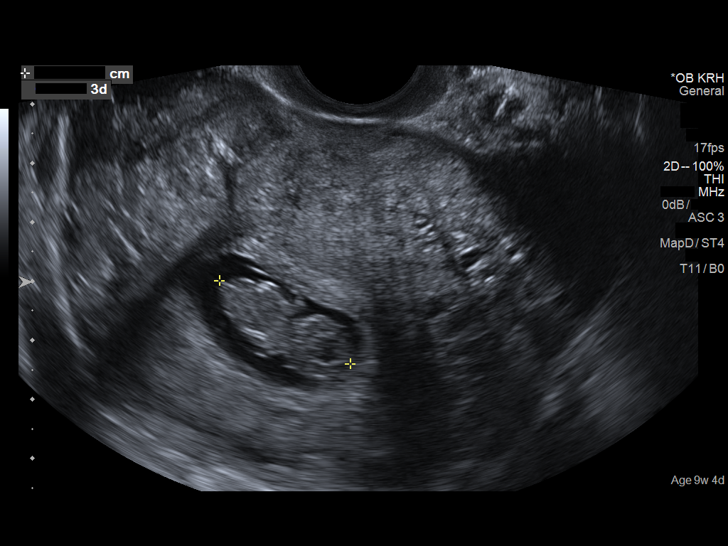
[im 16/21]
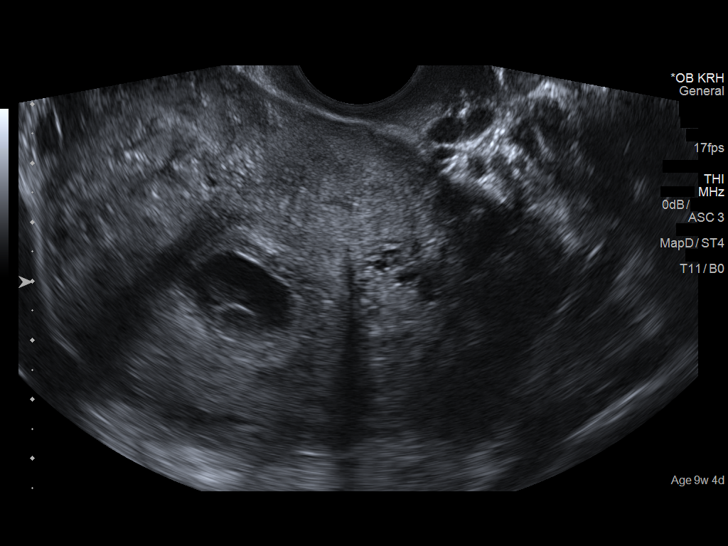
[im 18/21]
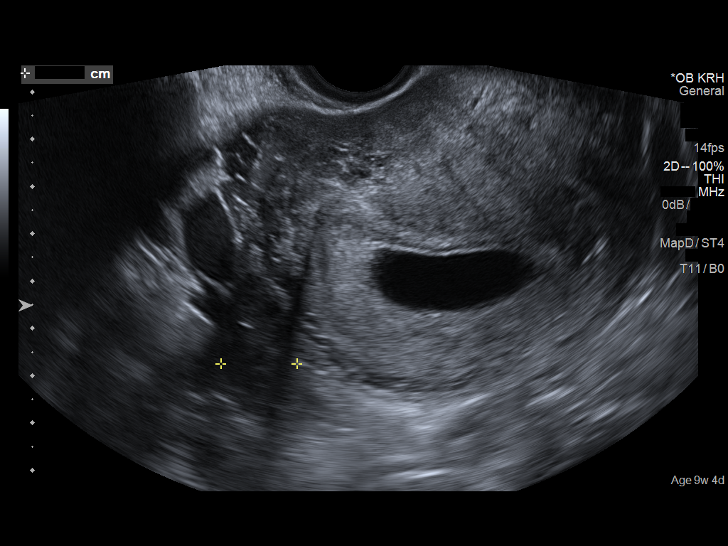
[im 19/21]
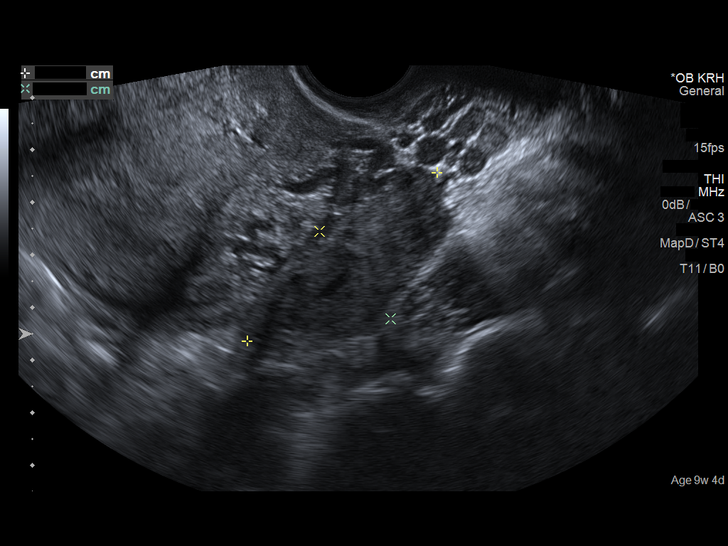
[im 21/21]
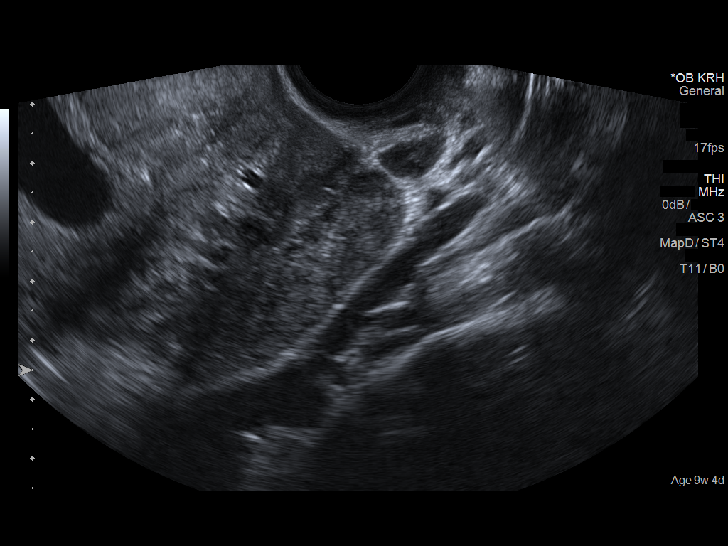

[14 of 21 positions shown; findings below may reference images not displayed]

FINDINGS: Intrauterine gestational sac: Single

Yolk sac:  Yes

Embryo:  Yes

Cardiac Activity: Yes

Heart Rate: 178  bpm

CRL:  26  mm   9 w   2 d                  US EDC: 04/14/2017

Subchorionic hemorrhage:  None visualized.

Maternal uterus/adnexae: Normal ovaries.  No free fluid.
IMPRESSION: Normal appearing single intrauterine pregnancy of approximately 9
weeks 2 days gestation.

## 2017-05-05 ENCOUNTER — Encounter: Payer: Self-pay | Admitting: Medical

## 2017-05-05 ENCOUNTER — Ambulatory Visit (INDEPENDENT_AMBULATORY_CARE_PROVIDER_SITE_OTHER): Payer: 59 | Admitting: Medical

## 2017-05-05 VITALS — BP 121/93 | HR 87 | Wt 201.3 lb

## 2017-05-05 DIAGNOSIS — F419 Anxiety disorder, unspecified: Secondary | ICD-10-CM

## 2017-05-05 DIAGNOSIS — Z1389 Encounter for screening for other disorder: Secondary | ICD-10-CM | POA: Diagnosis not present

## 2017-05-05 DIAGNOSIS — F329 Major depressive disorder, single episode, unspecified: Secondary | ICD-10-CM

## 2017-05-05 DIAGNOSIS — I10 Essential (primary) hypertension: Secondary | ICD-10-CM | POA: Insufficient documentation

## 2017-05-05 DIAGNOSIS — Z98891 History of uterine scar from previous surgery: Secondary | ICD-10-CM

## 2017-05-05 NOTE — Patient Instructions (Signed)

## 2017-05-05 NOTE — Progress Notes (Signed)
Subjective:     Virginia Guerrero is a 36 y.o. female who presents for a postpartum visit. She is 5 weeks postpartum following a low cervical transverse Cesarean section. I have fully reviewed the prenatal and intrapartum course. The delivery was at [id number] gestational weeks. Outcome: repeat cesarean section, low transverse incision. Anesthesia: epidural. Postpartum course has been normal. Baby's course has been normal. Baby is feeding by breast. Bleeding staining only. Bowel function is normal. Bladder function is normal. Patient is not sexually active. Contraception method is abstinence. Postpartum depression screening: negative. Anxiety +. Declines to see Phillips HayJamie, Behavoral Health Clinician.   The following portions of the patient's history were reviewed and updated as appropriate: allergies, current medications, past family history, past medical history, past social history, past surgical history and problem list.  Review of Systems Pertinent items are noted in HPI.   Objective:    BP (!) 121/93   Pulse 87   Wt 201 lb 4.8 oz (91.3 kg)   Breastfeeding? Yes   BMI 33.50 kg/m   General:  alert and cooperative   Breasts:  not evaluated  Lungs: clear to auscultation bilaterally  Heart:  regular rate and rhythm, S1, S2 normal, no murmur, click, rub or gallop  Abdomen: normal findings: no masses palpable, no organomegaly and soft, non-tender incision is well-healed. No drainage, bleeding or surround erythema    Vulva:  not evaluated  Vagina: not evaluated  Cervix:  not evaluated  Corpus: not examined  Adnexa:  not evaluated  Rectal Exam: Not performed.        Assessment:   Normal postpartum exam. Pap smear not done at today's visit.  Last pap was normal during pregnancy Anxiety CHTN  Lack of social support  Plan:    1. Contraception: abstinence 2. Referral to [facility] for HTN management  3. PP Doula resources given  4.  Follow up in: 1 year for annual exam or as needed.    Marny LowensteinWenzel, Julie  N, PA-C 05/05/2017 4:31 PM

## 2017-05-05 NOTE — Progress Notes (Signed)
Still c/o seeing dots, dizziness, flashing lights, headaches, swelling in right ankle/foot.

## 2017-05-26 ENCOUNTER — Encounter: Payer: Self-pay | Admitting: Family Medicine

## 2018-04-21 ENCOUNTER — Ambulatory Visit (HOSPITAL_COMMUNITY)
Admission: EM | Admit: 2018-04-21 | Discharge: 2018-04-21 | Disposition: A | Discharge status: Home / Self Care | Payer: Medicaid Other | Attending: Family Medicine | Admitting: Family Medicine

## 2018-04-21 ENCOUNTER — Encounter (HOSPITAL_COMMUNITY): Payer: Self-pay | Admitting: Emergency Medicine

## 2018-04-21 DIAGNOSIS — H60391 Other infective otitis externa, right ear: Secondary | ICD-10-CM

## 2018-04-21 DIAGNOSIS — H9201 Otalgia, right ear: Secondary | ICD-10-CM

## 2018-04-21 MED ORDER — AMOXICILLIN-POT CLAVULANATE 875-125 MG PO TABS: 1.0000 | ORAL_TABLET | Freq: Two times a day (BID) | ORAL | 0 refills | Status: AC

## 2018-04-21 MED ORDER — NEOMYCIN-POLYMYXIN-HC 3.5-10000-1 OT SUSP: 4.0000 [drp] | Freq: Three times a day (TID) | OTIC | 0 refills | Status: AC

## 2018-04-21 NOTE — ED Triage Notes (Signed)
PT reports pain and fullness in right ear. PT reports drainage from ear as well

## 2018-04-21 NOTE — ED Provider Notes (Addendum)
[facility]   [id number] 04/21/18 Arrival Time: 1755  CC: Right ear pain  SUBJECTIVE: History from: patient.  Virginia Guerrero is a 37 y.o. female who presents with right ear pain for the past 3 days.  Denies a precipitating event, but does admit to wearing ear plugs. Patient has tried ibuprofen with minimal relief.  Symptoms are made worse with yawning.  Denies similar symptoms in the past.  Complains of associated fatigue, white/ yellow otorrhea, and decreased hearing.   Denies fever, chills, fatigue, sinus pain, rhinorrhea, SOB, wheezing, chest pain, nausea, changes in bowel or bladder habits.    ROS: As per HPI.  Past Medical History:  Diagnosis Date  . Abortion, nontherapeutic    in a clinic  . Carpal tunnel syndrome   . Hx of chest pain   . Infections of genitourinary tract in pregnancy, unspecified as to episode of care(646.60)   . No pertinent past medical history   . Painful respiration    costochondral pain  . Syncope and collapse    Past Surgical History:  Procedure Laterality Date  . CESAREAN SECTION N/A 11/17/2012   Procedure: CESAREAN SECTION;  Surgeon: Lenoard Aden, MD;  Location: [facility];  Service: Obstetrics;  Laterality: N/A;  . CESAREAN SECTION N/A 03/28/2017   Procedure: CESAREAN SECTION;  Surgeon: Adam Phenix, MD;  Location: [facility];  Service: Obstetrics;  Laterality: N/A;  . INDUCED ABORTION     in a clinic   Allergies  Allergen Reactions  . Latex Itching    condoms   No current facility-administered medications on file prior to encounter.    Current Outpatient Medications on File Prior to Encounter  Medication Sig Dispense Refill  . Prenatal Vit-Fe Fumarate-FA (MULTIVITAMIN-PRENATAL) 27-0.8 MG TABS tablet Take 1 tablet by mouth daily at 12 noon.    Marland Kitchen amLODipine (NORVASC) 5 MG tablet Take 1 tablet (5 mg total) by mouth daily. 30 tablet 2  . docusate sodium (COLACE) 100 MG capsule Take 1 capsule (100 mg total) by mouth 2  (two) times daily as needed. 30 capsule 2  . ferrous sulfate (FERROUSUL) 325 (65 FE) MG tablet Take 1 tablet (325 mg total) by mouth 2 (two) times daily. 60 tablet 1  . ibuprofen (ADVIL,MOTRIN) 600 MG tablet Take 1 tablet (600 mg total) by mouth every 6 (six) hours as needed for moderate pain. 60 tablet 2   Social History   Socioeconomic History  . Marital status: Single    Spouse name: Not on file  . Number of children: Not on file  . Years of education: Not on file  . Highest education level: Not on file  Occupational History  . Not on file  Social Needs  . Financial resource strain: Not on file  . Food insecurity:    Worry: Not on file    Inability: Not on file  . Transportation needs:    Medical: Not on file    Non-medical: Not on file  Tobacco Use  . Smoking status: Never Smoker  . Smokeless tobacco: Never Used  Substance and Sexual Activity  . Alcohol use: No    Comment: not with pregnancy  . Drug use: No    Types: Marijuana    Comment: not since pregnancy  . Sexual activity: Not Currently    Birth control/protection: None  Lifestyle  . Physical activity:    Days per week: Not on file    Minutes per session: Not on file  . Stress:  Not on file  Relationships  . Social connections:    Talks on phone: Not on file    Gets together: Not on file    Attends religious service: Not on file    Active member of club or organization: Not on file    Attends meetings of clubs or organizations: Not on file    Relationship status: Not on file  . Intimate partner violence:    Fear of current or ex partner: Not on file    Emotionally abused: Not on file    Physically abused: Not on file    Forced sexual activity: Not on file  Other Topics Concern  . Not on file  Social History Narrative  . Not on file   Family History  Problem Relation Age of Onset  . Arthritis Other   . Lung cancer Father   . Cancer Father        nasopharygeal  . Cancer Paternal Uncle         pancreatic  . Hypertension Maternal Grandfather   . Cancer Paternal Grandmother        breast  . Hypertension Mother   . Cancer Maternal Grandmother        lymphoma    OBJECTIVE:  Vitals:   04/21/18 1824 04/21/18 1834  BP: (!) 117/93   Pulse: 90   Resp: 16   Temp:  98.8 F (37.1 C)  TempSrc:  Temporal  SpO2: 100%      General appearance: alert; active; nontoxic appearance HEENT: Ears: LT EAC clear, LT TM pearly gray with visible cone of light, without erythema, RT EAC occluded with white discharge, TM not visible Eyes: PERRL, EOMI grossly; Nose: no obvious rhinorrhea; tonsils nonerythematous, uvula midline  Neck: supple without LAD Lungs: CTAB Heart: regular rate and rhythm.  Radial pulses 2+ symmetrical bilaterally Skin: warm and dry Psychological: alert and cooperative; normal mood and affect  ASSESSMENT & PLAN:  1. Other infective acute otitis externa of right ear   2. Otalgia of right ear     Meds ordered this encounter  Medications  . amoxicillin-clavulanate (AUGMENTIN) 875-125 MG tablet    Sig: Take 1 tablet by mouth every 12 (twelve) hours for 10 days.    Dispense:  20 tablet    Refill:  0    Order Specific Question:   Supervising Provider    Answer:   Isa Rankin [phone]  . neomycin-polymyxin-hydrocortisone (CORTISPORIN) 3.5-10000-1 OTIC suspension    Sig: Place 4 drops into the right ear 3 (three) times daily for 10 days.    Dispense:  10 mL    Refill:  0    Order Specific Question:   Supervising Provider    Answer:   Isa Rankin [[id number]]    Rest and drink plenty of fluids Prescribed augmentin 875 for 10 days Ear wick placed Prescribed ciprofloxacin ear drops Take medications as directed and to completion Continue to use OTC ibuprofen and/ or tylenol as needed for pain control Return or follow up with [facility] if symptoms persists Return here or go to the ER if you have any new or worsening symptoms    Reviewed expectations re: course of current medical issues. Questions answered. Outlined signs and symptoms indicating need for more acute intervention. Patient verbalized understanding. After Visit Summary given.         Rennis Harding, PA-C 04/21/18 1924    Rennis Harding, PA-C 04/21/18 1924

## 2018-04-21 NOTE — Discharge Instructions (Addendum)
Rest and drink plenty of fluids Prescribed augmentin 875 for 10 days Ear wick placed Prescribed ciprofloxacin ear drops Take medications as directed and to completion Continue to use OTC ibuprofen and/ or tylenol as needed for pain control Return or follow up with [facility] if symptoms persists Return here or go to the ER if you have any new or worsening symptoms

## 2018-07-25 IMAGING — US US MFM OB COMP +14 WKS
1 series · 14 of 28 positions shown · non-contrast
Comparison: none

[Series 1: us mfm ob comp +14 wks · 85 acquisitions, 14 frames shown]
[im 4/85]
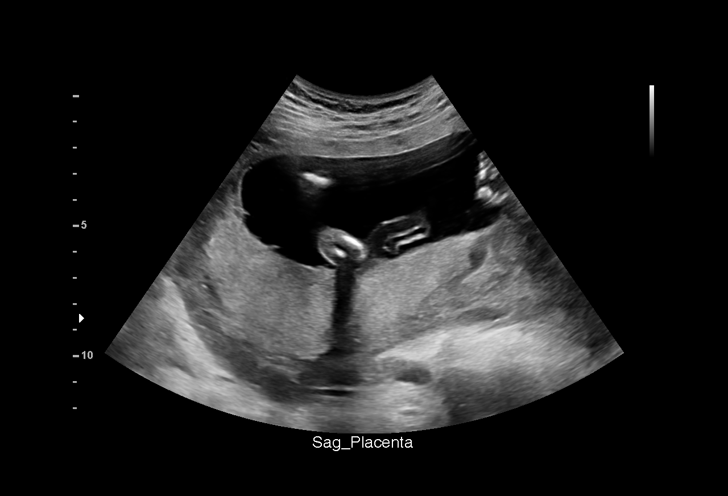
[im 10/85]
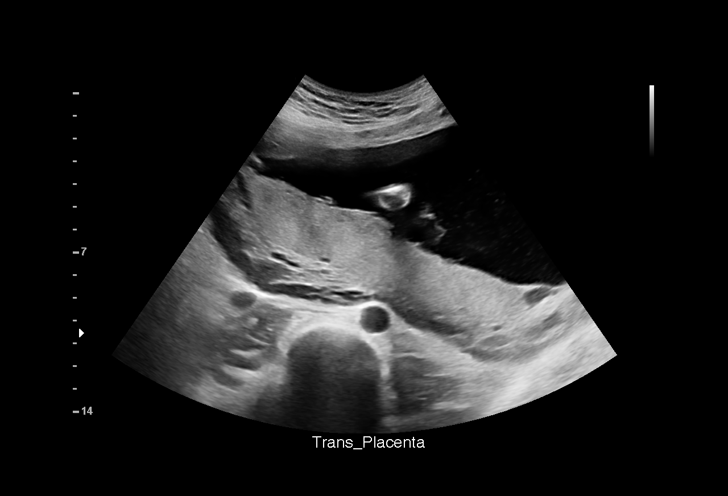
[im 16/85]
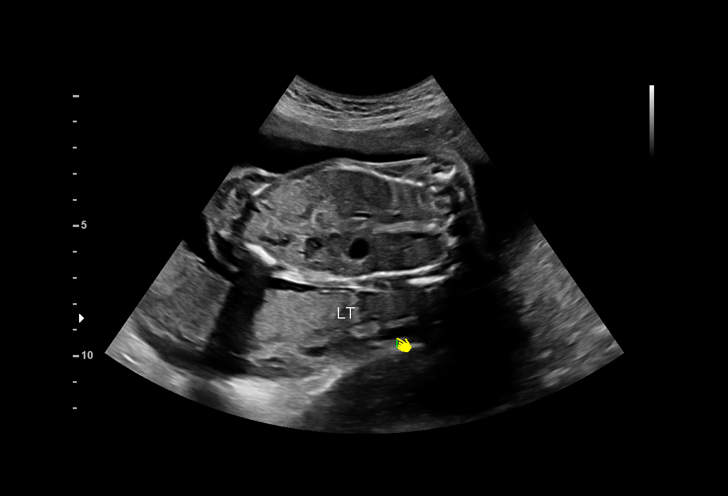
[im 22/85]
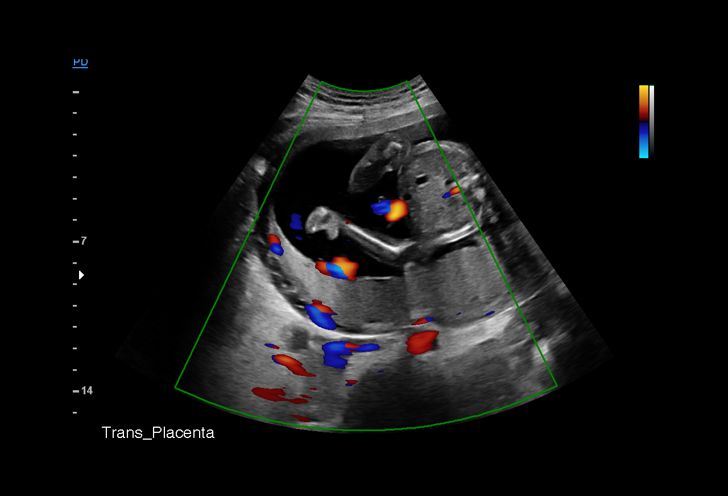
[im 29/85]
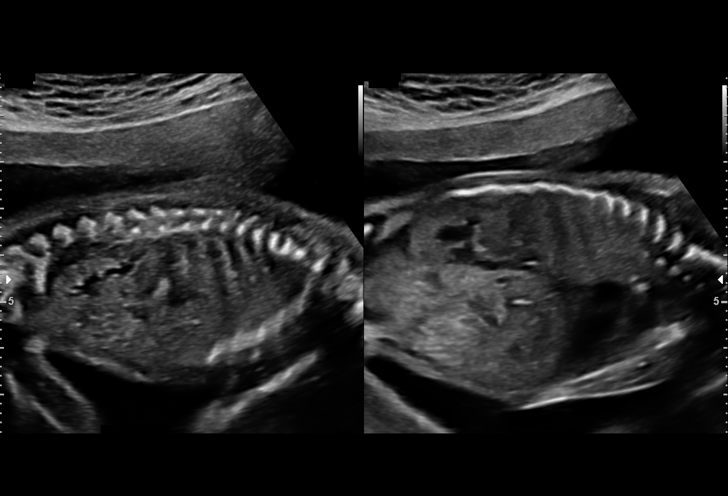
[im 35/85]
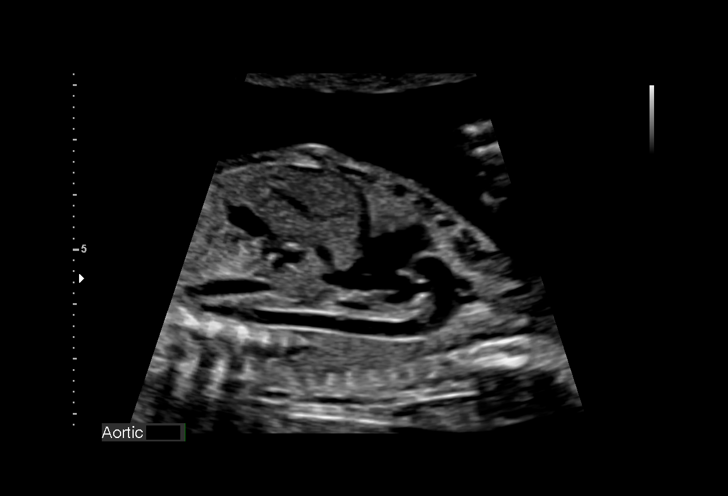
[im 41/85]
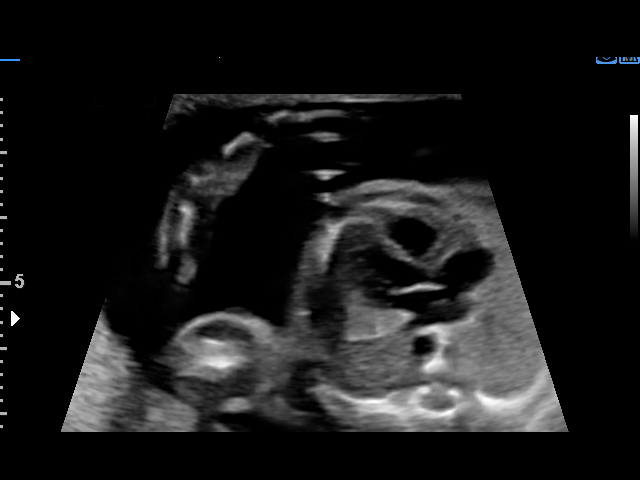
[im 47/85]
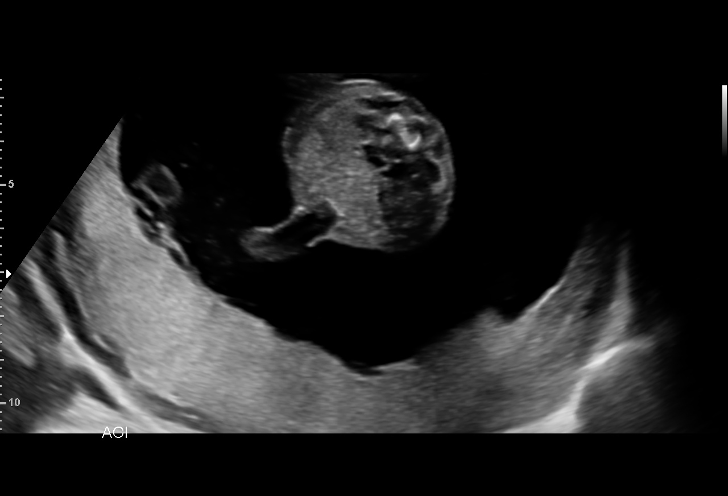
[im 53/85]
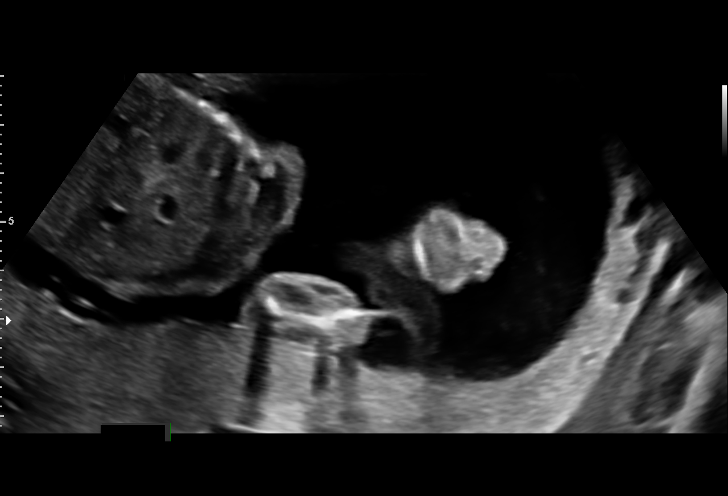
[im 60/85]
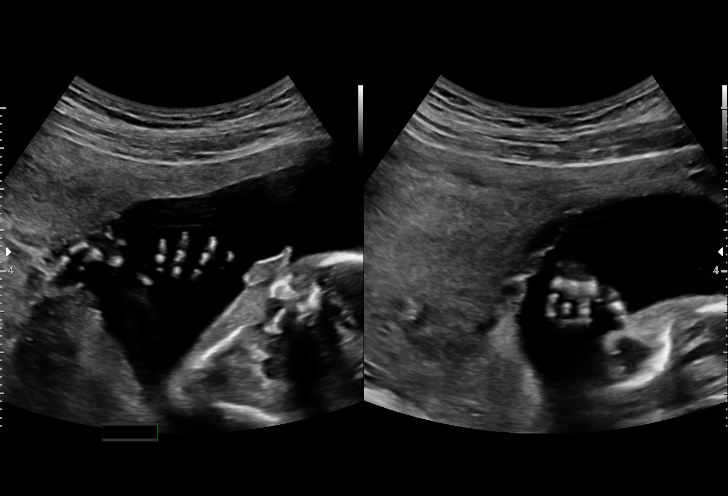
[im 66/85]
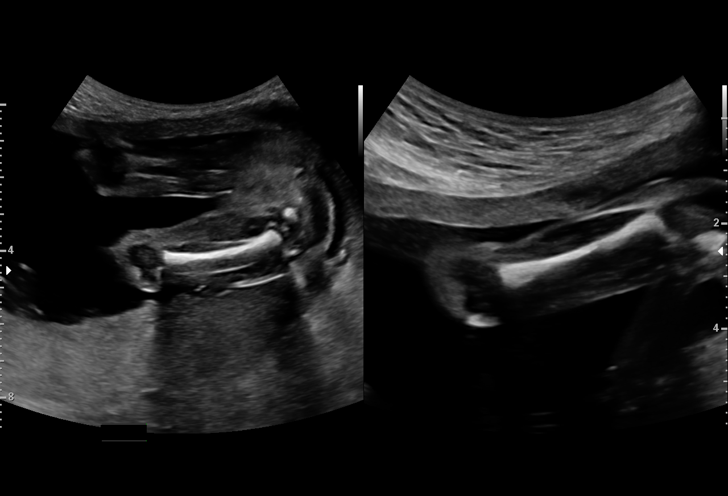
[im 72/85]
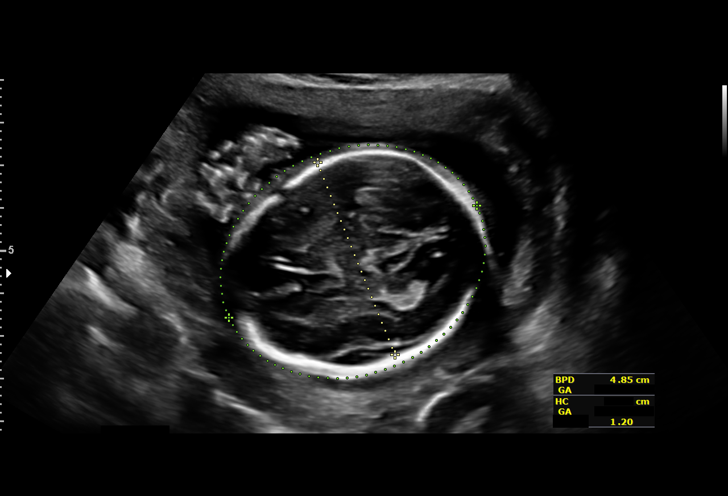
[im 78/85]
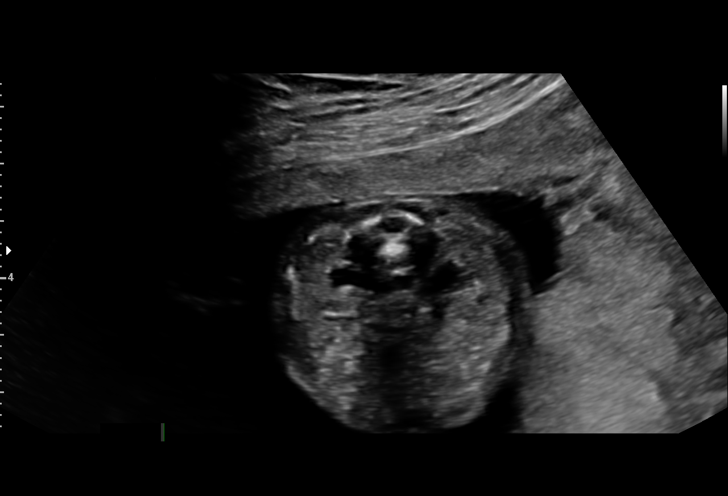
[im 85/85]
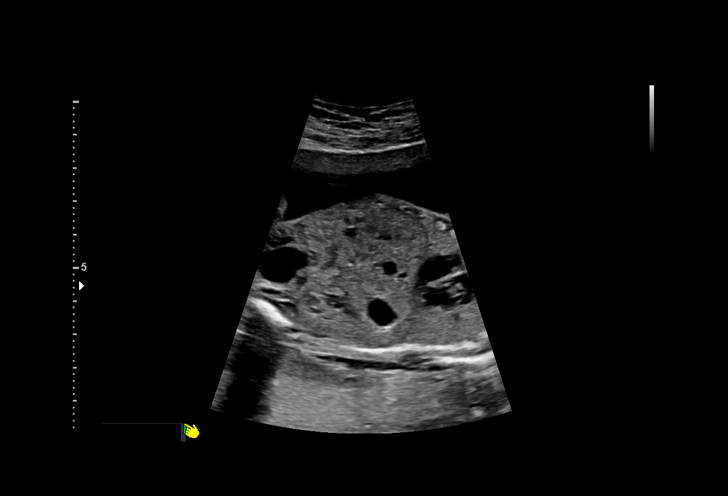

[14 of 28 positions shown; findings below may reference images not displayed]

OB/Gyn Clinic

1  INGUNN HARPA RONLOR              [id number]      [id number]     [id number]
Indications

20 weeks gestation of pregnancy
Encounter for fetal anatomic survey
OB History

Gravidity:    4         Term:   2        Prem:   0        SAB:   0
TOP:          1       Ectopic:  0        Living: 2
Fetal Evaluation

Num Of Fetuses:     1
Fetal Heart         150
Rate(bpm):
Cardiac Activity:   Observed
Presentation:       Cephalic
Placenta:           Posterior, above cervical os
P. Cord Insertion:  Visualized, central

Amniotic Fluid
AFI FV:      Subjectively within normal limits

Largest Pocket(cm)
5.66
Biometry

BPD:      48.5  mm     G. Age:  20w 5d         54  %    CI:         70.8   %   70 - 86
FL/HC:      17.1   %   15.9 -
HC:      183.7  mm     G. Age:  20w 5d         49  %    HC/AC:      1.21       1.06 -
AC:      151.5  mm     G. Age:  20w 2d         38  %    FL/BPD:     64.7   %
FL:       31.4  mm     G. Age:  19w 5d         17  %    FL/AC:      20.7   %   20 - 24
HUM:      31.1  mm     G. Age:  20w 2d         43  %
CER:        22  mm     G. Age:  21w 0d         56  %

CM:        5.7  mm
Est. FW:     337  gm    0 lb 12 oz      38  %
Gestational Age

LMP:           20w 4d       Date:   07/06/16                 EDD:   04/12/17
U/S Today:     20w 3d                                        EDD:   04/13/17
Best:          20w 4d    Det. By:   LMP  (07/06/16)          EDD:   04/12/17
Anatomy

Cranium:               Appears normal         Aortic Arch:            Appears normal
Cavum:                 Appears normal         Ductal Arch:            Appears normal
Ventricles:            Appears normal         Diaphragm:              Appears normal
Choroid Plexus:        Appears normal         Stomach:                Appears normal, left
sided
Cerebellum:            Appears normal         Abdomen:                Appears normal
Posterior Fossa:       Appears normal         Abdominal Wall:         Appears nml (cord
insert, abd wall)
Nuchal Fold:           Not applicable (>20    Cord Vessels:           Appears normal (3
wks GA)                                        vessel cord)
Face:                  Appears normal         Kidneys:                Bilateral UTD
(orbits and profile)
Lips:                  Appears normal         Bladder:                Appears normal
Thoracic:              Appears normal         Spine:                  Appears normal
Heart:                 Appears normal         Upper Extremities:      Appears normal
(4CH, axis, and
situs)
RVOT:                  Appears normal         Lower Extremities:      Appears normal
LVOT:                  Appears normal

Other:  Fetus appears to be a male. Heels and 5th digit visualized. Nasal
bone visualized. Open hands visualized.
Cervix Uterus Adnexa

Cervix
Length:            3.6  cm.
Normal appearance by transabdominal scan.

Uterus
No abnormality visualized.

Left Ovary
Not visualized.

Right Ovary
Not visualized.

Cul De Sac:   No free fluid seen.

Adnexa:       No abnormality visualized.
Impression

Singleton intrauterine pregnancy at 20+4 weeks here for
anatomic survey
Review of the anatomy shows bilateral mild UTD measuring
4mm on the right and 5mm on the left. Otherwise there are
no sonographic markers for aneuploidy or structural
anomalies
Amniotic fluid volume is normal
Estimated fetal weight is 337g which is growth in the 38th
percentile
Recommendations

Would repeat scan in 6 weeks to assess growth and UTD.

## 2018-10-16 IMAGING — US US MFM OB FOLLOW-UP
1 series · 14 of 28 positions shown · non-contrast
Comparison: none

[Series 1: us mfm ob follow-up · 14 of 33 slices shown]
[im 2/33]
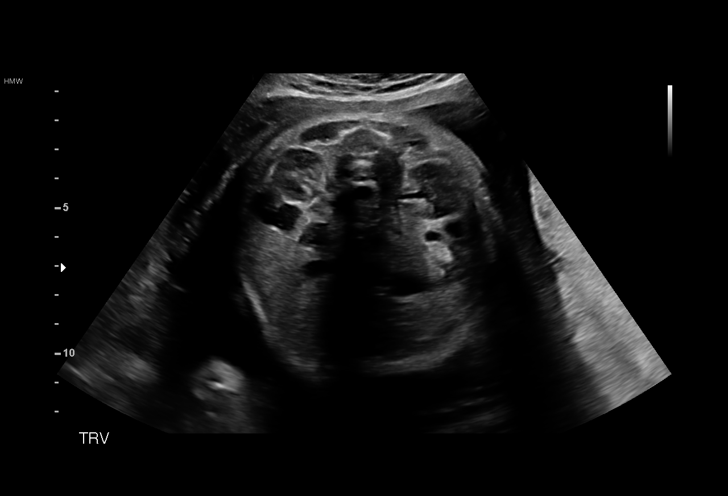
[im 4/33]
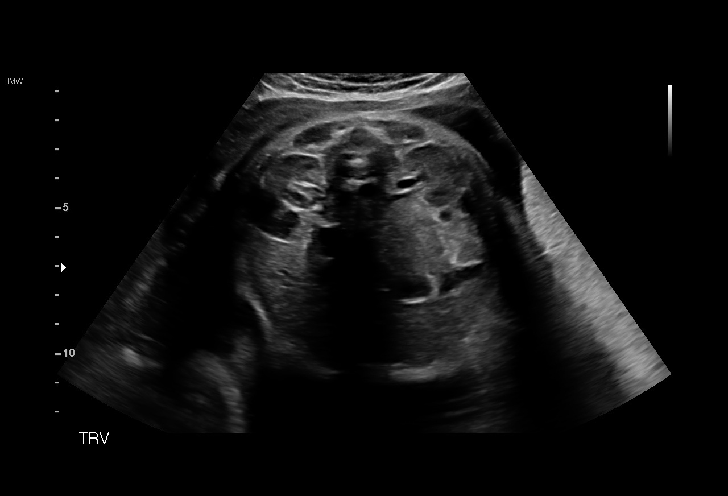
[im 6/33]
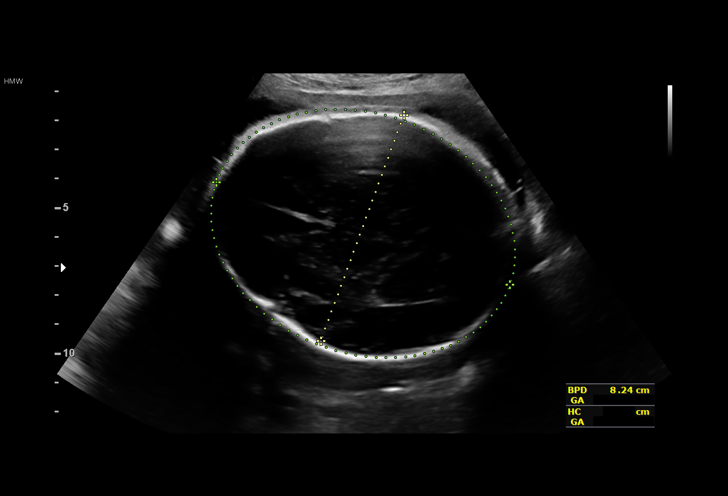
[im 9/33]
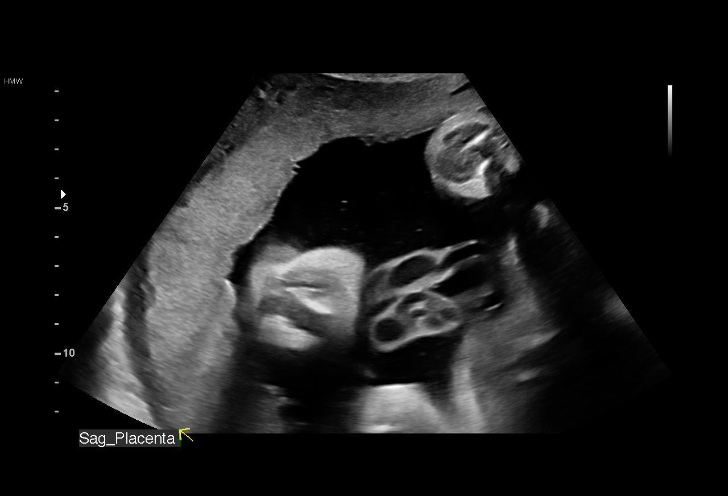
[im 11/33]
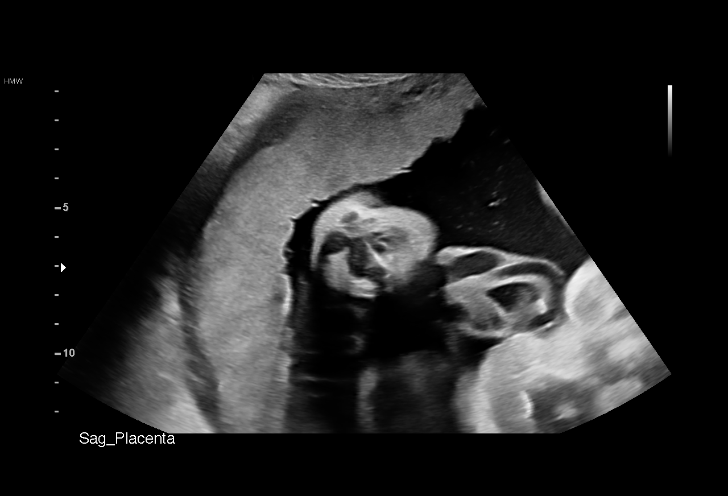
[im 14/33]
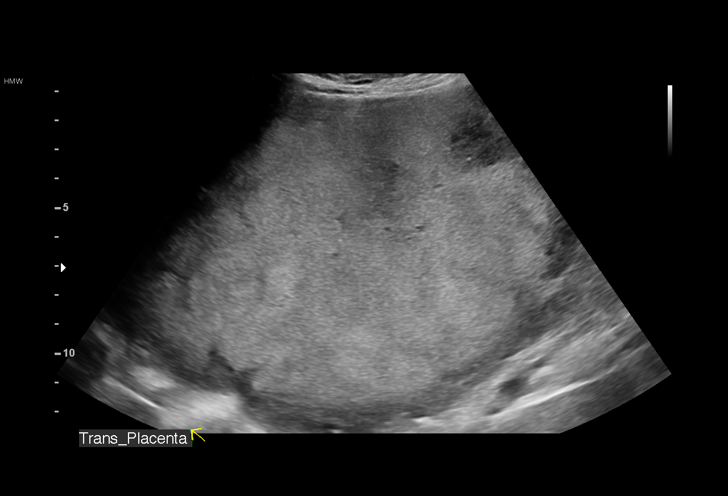
[im 16/33]
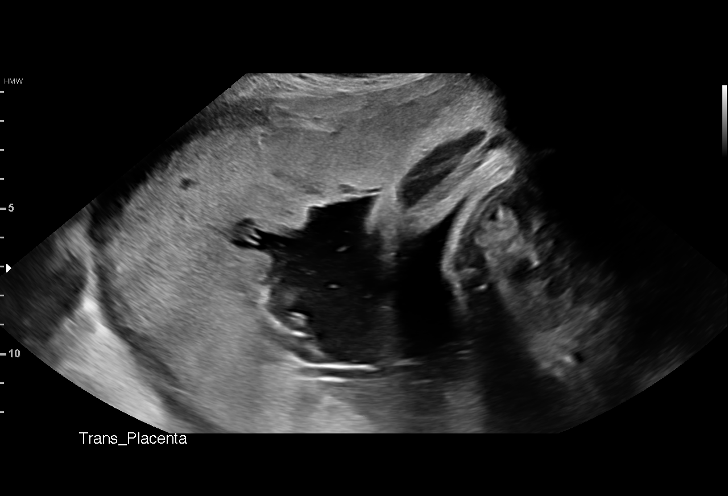
[im 18/33]
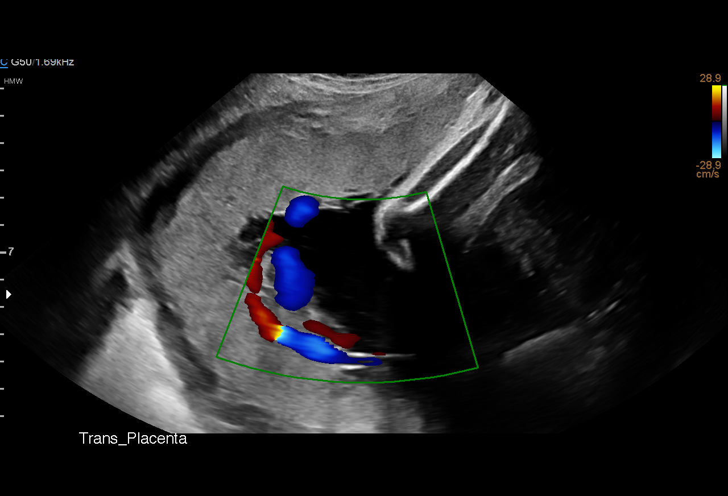
[im 21/33]
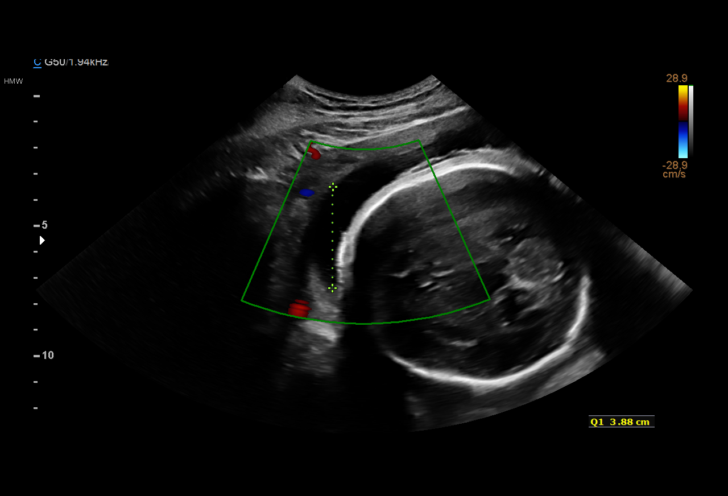
[im 23/33]
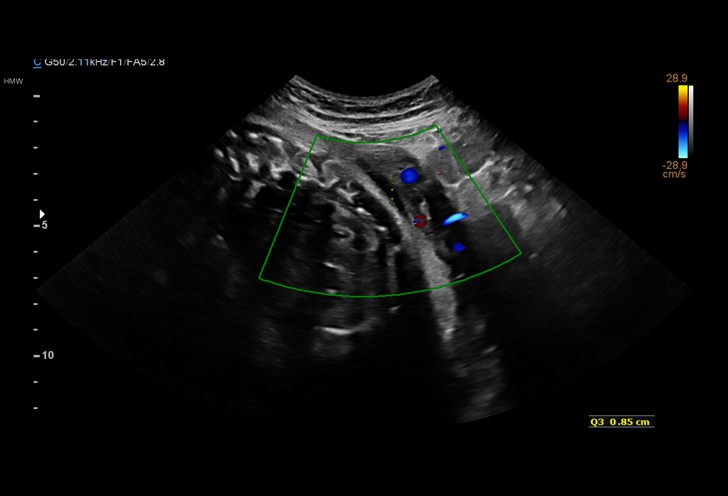
[im 25/33]
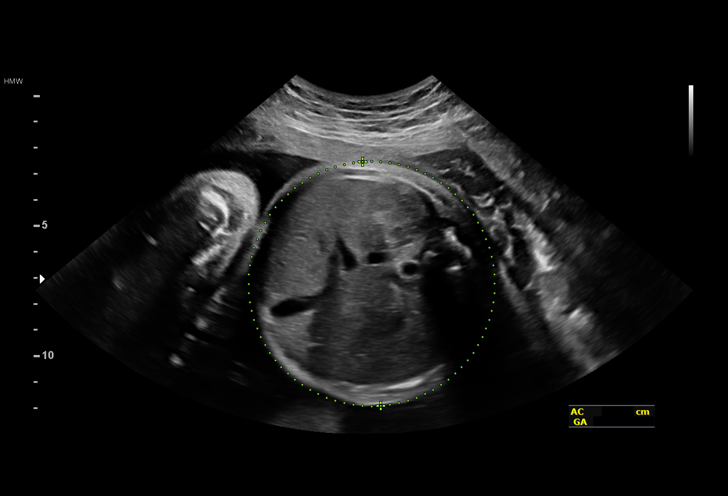
[im 28/33]
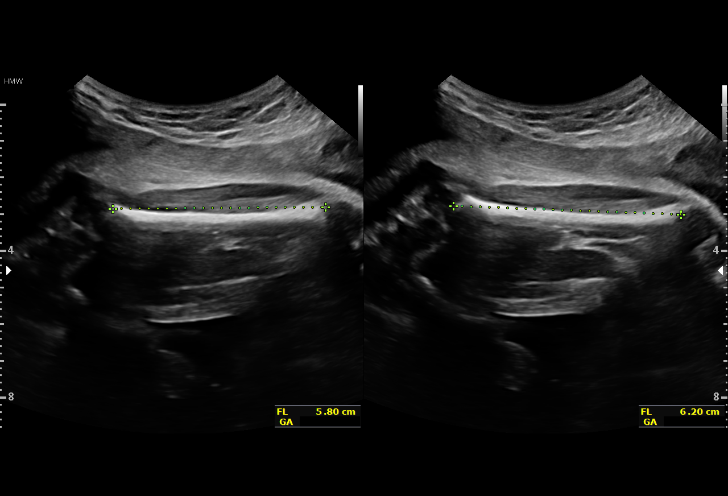
[im 30/33]
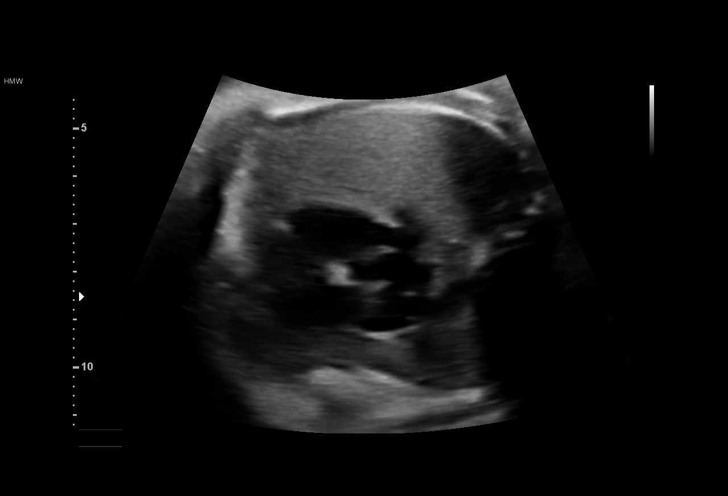
[im 33/33]
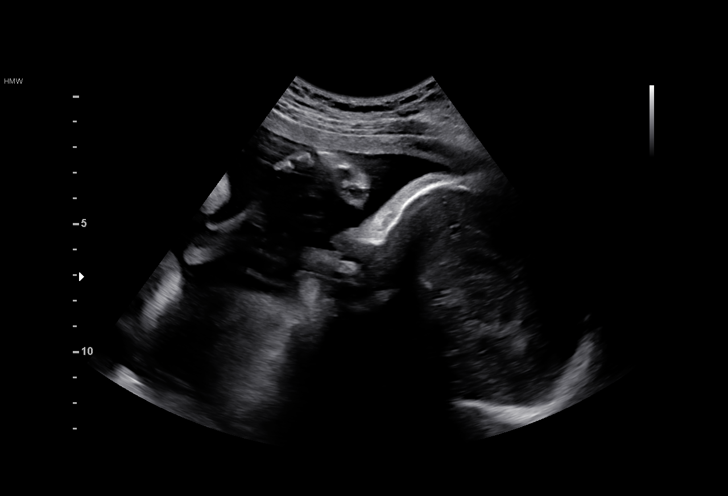

[14 of 28 positions shown; findings below may reference images not displayed]

OB/Gyn Clinic

1  POLIN BILLIOT            [id number]      [id number]     [id number]
Indications

32 weeks gestation of pregnancy
Previous cesarean delivery, antepartum
Fetal abnormality - other known or
suspected (renal pyelectasis)
OB History

Gravidity:    4         Term:   2        Prem:   0        SAB:   0
TOP:          1       Ectopic:  0        Living: 2
Fetal Evaluation

Num Of Fetuses:     1
Fetal Heart         143
Rate(bpm):
Cardiac Activity:   Observed
Presentation:       Cephalic
Placenta:           Posterior, above cervical os
P. Cord Insertion:  Visualized, central

Amniotic Fluid
AFI FV:      Subjectively within normal limits

AFI Sum(cm)     %Tile       Largest Pocket(cm)
16.26           59

RUQ(cm)       RLQ(cm)       LUQ(cm)        LLQ(cm)
3.88
Biometry

BPD:      82.9  mm     G. Age:  33w 3d         70  %    CI:        76.72   %   70 - 86
FL/HC:      20.0   %   19.1 -
HC:      299.8  mm     G. Age:  33w 2d         34  %    HC/AC:      1.01       0.96 -
AC:      296.1  mm     G. Age:  33w 4d         80  %    FL/BPD:     72.4   %   71 - 87
FL:         60  mm     G. Age:  31w 1d         12  %    FL/AC:      20.3   %   20 - 24
HUM:      52.4  mm     G. Age:  30w 4d         16  %

Est. FW:    7816  gm      4 lb 9 oz     65  %
Gestational Age

LMP:           32w 3d       Date:   07/06/16                 EDD:   04/12/17
U/S Today:     32w 6d                                        EDD:   04/09/17
Best:          32w 3d    Det. By:   LMP  (07/06/16)          EDD:   04/12/17
Anatomy

Cranium:               Appears normal         Aortic Arch:            Previously seen
Cavum:                 Previously seen        Ductal Arch:            Previously seen
Ventricles:            Appears normal         Diaphragm:              Previously seen
Choroid Plexus:        Previously seen        Stomach:                Appears normal, left
sided
Cerebellum:            Previously seen        Abdomen:                Previously seen
Posterior Fossa:       Previously seen        Abdominal Wall:         Previously seen
Nuchal Fold:           Not applicable (>20    Cord Vessels:           Previously seen
wks GA)
Face:                  Orbits and profile     Kidneys:                Appear normal
previously seen
Lips:                  Previously seen        Bladder:                Appears normal
Thoracic:              Appears normal         Spine:                  Previously seen
Heart:                 Previously seen        Upper Extremities:      Previously seen
RVOT:                  Appears normal         Lower Extremities:      Previously seen
LVOT:                  Appears normal

Other:  Fetus appears to be a male. Heels and 5th digit visualized previously.
Nasal bone visualized previously. Open hands visualized previously.
Cervix Uterus Adnexa

Cervix
Not visualized (advanced GA >37wks)

Uterus
No abnormality visualized.

Left Ovary
No adnexal mass visualized.

Right Ovary
No adnexal mass visualized.

Cul De Sac:   No free fluid seen.

Adnexa:       No abnormality visualized.
Impression

SIUP at 32+3 weeks
Normal interval anatomy; anatomic survey complete; kidneys
normal today
Normal amniotic fluid volume
Appropriate interval growth with EFW at the 65th %tile
Recommendations

Follow-up as clinically indicated

## 2019-01-24 ENCOUNTER — Ambulatory Visit (HOSPITAL_COMMUNITY)
Admission: EM | Admit: 2019-01-24 | Discharge: 2019-01-24 | Disposition: A | Discharge status: Home / Self Care | Payer: Medicaid Other | Attending: Emergency Medicine | Admitting: Emergency Medicine

## 2019-01-24 ENCOUNTER — Encounter (HOSPITAL_COMMUNITY): Payer: Self-pay | Admitting: Emergency Medicine

## 2019-01-24 DIAGNOSIS — H60312 Diffuse otitis externa, left ear: Secondary | ICD-10-CM | POA: Diagnosis not present

## 2019-01-24 MED ORDER — NEOMYCIN-POLYMYXIN-HC 3.5-10000-1 OT SUSP: 3.0000 [drp] | Freq: Three times a day (TID) | OTIC | 0 refills | Status: AC

## 2019-01-24 MED ORDER — AMOXICILLIN-POT CLAVULANATE 875-125 MG PO TABS: 1.0000 | ORAL_TABLET | Freq: Two times a day (BID) | ORAL | 0 refills | Status: AC

## 2019-01-24 NOTE — ED Triage Notes (Signed)
Pt c/o L ear pain since yesterday.

## 2019-01-24 NOTE — Discharge Instructions (Addendum)
Augmentin twice daily x 1 week Cortisporin ear drops 3 times daily for 1 week  Use anti-inflammatories for pain/swelling. You may take up to 800 mg Ibuprofen every 8 hours with food. You may supplement Ibuprofen with Tylenol [phone] mg every 8 hours.   Follow up if not resolving or worsening

## 2019-01-24 NOTE — ED Provider Notes (Signed)
[name]    CSN: [id number] Arrival date & time: 01/24/19  1325      History   Chief Complaint Chief Complaint  Patient presents with  . Appointment    2pm  . Otalgia    HPI Virginia Guerrero is a 38 y.o. female history of hypertension, presenting today for evaluation of left ear pain.  Patient states that over the past 1 to 2 days she has had worsening pain itching and decreased hearing out of her left ear.  Feels very similar to when she was here in February for an ear infection.  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  She has been using it on her left side of recently.  She notes that she is also had white discharge when cleaning out her ear.  She has had increased tragus tenderness.  She denies significant URI symptoms of congestion cough or sore throat, but has started to feel some mild slight left sinus congestion/pressure.  Denies fevers.  HPI  Past Medical History:  Diagnosis Date  . Abortion, nontherapeutic    in a clinic  . Carpal tunnel syndrome   . Hx of chest pain   . Infections of genitourinary tract in pregnancy, unspecified as to episode of care(646.60)   . No pertinent past medical history   . Painful respiration    costochondral pain  . Syncope and collapse     Patient Active Problem List   Diagnosis Date Noted  . Hypertension 05/05/2017  . S/P cesarean section 11/07/2016  . Postpartum anemia 06/20/2011  . Anxiety and depression 06/20/2011    Past Surgical History:  Procedure Laterality Date  . CESAREAN SECTION N/A 11/17/2012   Procedure: CESAREAN SECTION;  Surgeon: Lovenia Kim, MD;  Location: [facility];  Service: Obstetrics;  Laterality: N/A;  . CESAREAN SECTION N/A 03/28/2017   Procedure: CESAREAN SECTION;  Surgeon: Woodroe Mode, MD;  Location: [place];  Service: Obstetrics;  Laterality: N/A;  . INDUCED ABORTION     in a  clinic    OB History    Gravida  4   Para  3   Term  3   Preterm  0   AB  1   Living  3     SAB  0   TAB  1   Ectopic  0   Multiple  0   Live Births  3            Home Medications    Prior to Admission medications   Medication Sig Start Date End Date Taking? Authorizing Provider  amoxicillin-clavulanate (AUGMENTIN) 875-125 MG tablet Take 1 tablet by mouth every 12 (twelve) hours for 7 days. 01/24/19 01/31/19  Evetta Renner C, PA-C  ibuprofen (ADVIL,MOTRIN) 600 MG tablet Take 1 tablet (600 mg total) by mouth every 6 (six) hours as needed for moderate pain. 03/31/17   Anyanwu, Sallyanne Havers, MD  neomycin-polymyxin-hydrocortisone (CORTISPORIN) 3.5-10000-1 OTIC suspension Place 3 drops into both ears 3 (three) times daily for 7 days. 01/24/19 01/31/19  Trudy Kory C, PA-C  Prenatal Vit-Fe Fumarate-FA (MULTIVITAMIN-PRENATAL) 27-0.8 MG TABS tablet Take 1 tablet by mouth daily at 12 noon.    [provider]  amLODipine (NORVASC) 5 MG tablet Take 1 tablet (5 mg total) by mouth daily. Patient not taking: Reported on 01/24/2019 03/31/17 01/24/19  Osborne Oman, MD  ferrous sulfate (  FERROUSUL) 325 (65 FE) MG tablet Take 1 tablet (325 mg total) by mouth 2 (two) times daily. Patient not taking: Reported on 01/24/2019 03/31/17 01/24/19  Tereso NewcomerAnyanwu, Ugonna A, MD    Family History Family History  Problem Relation Age of Onset  . Arthritis Other   . Lung cancer Father   . Cancer Father        nasopharygeal  . Cancer Paternal Uncle        pancreatic  . Hypertension Maternal Grandfather   . Cancer Paternal Grandmother        breast  . Hypertension Mother   . Cancer Maternal Grandmother        lymphoma    Social History Social History   Tobacco Use  . Smoking status: Never Smoker  . Smokeless tobacco: Never Used  Substance Use Topics  . Alcohol use: No    Comment: not with pregnancy  . Drug use: No    Types: Marijuana    Comment: not since pregnancy      Allergies   Latex   Review of Systems Review of Systems  Constitutional: Negative for activity change, appetite change, chills, fatigue and fever.  HENT: Positive for congestion, ear pain and hearing loss. Negative for rhinorrhea, sinus pressure, sore throat and trouble swallowing.   Eyes: Negative for discharge and redness.  Respiratory: Negative for cough, chest tightness and shortness of breath.   Cardiovascular: Negative for chest pain.  Gastrointestinal: Negative for abdominal pain, diarrhea, nausea and vomiting.  Musculoskeletal: Negative for myalgias.  Skin: Negative for rash.  Neurological: Negative for dizziness, light-headedness and headaches.     Physical Exam Triage Vital Signs ED Triage Vitals  Enc Vitals Group     BP 01/24/19 1359 126/90     Pulse Rate 01/24/19 1359 79     Resp 01/24/19 1359 16     Temp 01/24/19 1359 98.2 F (36.8 C)     Temp src --      SpO2 01/24/19 1359 100 %     Weight --      Height --      Head Circumference --      Peak Flow --      Pain Score 01/24/19 1400 10     Pain Loc --      Pain Edu? --      Excl. in GC? --    No data found.  Updated Vital Signs BP 126/90   Pulse 79   Temp 98.2 F (36.8 C)   Resp 16   LMP 01/03/2019   SpO2 100%   Visual Acuity Right Eye Distance:   Left Eye Distance:   Bilateral Distance:    Right Eye Near:   Left Eye Near:    Bilateral Near:     Physical Exam Vitals signs and nursing note reviewed.  Constitutional:      General: She is not in acute distress.    Appearance: She is well-developed.  HENT:     Head: Normocephalic and atraumatic.     Ears:     Comments: Right EAC without erythema or swelling, does appear to have mild amount of white debris present in canal, right TM pearly gray, good bony landmarks  Left external auricle without tenderness palpation, left tragus appears swollen and erythematous, canal largely occluded, unable to visualize TM due to amount of swelling.     Mouth/Throat:     Comments: Oral mucosa pink and moist, no tonsillar enlargement or exudate. Posterior pharynx  patent and nonerythematous, no uvula deviation or swelling. Normal phonation. Eyes:     Conjunctiva/sclera: Conjunctivae normal.  Neck:     Musculoskeletal: Neck supple.  Cardiovascular:     Rate and Rhythm: Normal rate and regular rhythm.     Heart sounds: No murmur.  Pulmonary:     Effort: Pulmonary effort is normal. No respiratory distress.     Breath sounds: Normal breath sounds.  Abdominal:     Palpations: Abdomen is soft.     Tenderness: There is no abdominal tenderness.  Skin:    General: Skin is warm and dry.  Neurological:     Mental Status: She is alert.      UC Treatments / Results  Labs (all labs ordered are listed, but only abnormal results are displayed) Labs Reviewed - No data to display  EKG   Radiology No results found.  Procedures Procedures (including critical care time)  Medications Ordered in UC Medications - No data to display  Initial Impression / Assessment and Plan / UC Course  I have reviewed the triage vital signs and the nursing notes.  Pertinent labs & imaging results that were available during my care of the patient were reviewed by me and considered in my medical decision making (see chart for details).     Left otitis externa, given amount of swelling and inability get visualize TM will initiate on oral antibiotics with Augmentin.  Will also provide course forearm to help with itching sensation, wick placed in ear. Follow up symptoms not resolving or worsening. Discussed strict return precautions. Patient verbalized understanding and is agreeable with plan.  Final Clinical Impressions(s) / UC Diagnoses   Final diagnoses:  Diffuse otitis externa of left ear, unspecified chronicity     Discharge Instructions     Augmentin twice daily x 1 week Cortisporin ear drops 3 times daily for 1 week  Use anti-inflammatories for  pain/swelling. You may take up to 800 mg Ibuprofen every 8 hours with food. You may supplement Ibuprofen with Tylenol [phone] mg every 8 hours.   Follow up if not resolving or worsening   ED Prescriptions    Medication Sig Dispense Auth. Provider   amoxicillin-clavulanate (AUGMENTIN) 875-125 MG tablet Take 1 tablet by mouth every 12 (twelve) hours for 7 days. 14 tablet Calahan Pak C, PA-C   neomycin-polymyxin-hydrocortisone (CORTISPORIN) 3.5-10000-1 OTIC suspension Place 3 drops into both ears 3 (three) times daily for 7 days. 10 mL Reah Justo C, PA-C     Controlled Substance Prescriptions [place] Controlled Substance Registry consulted? Not Applicable   Lew DawesWieters, Cailan Antonucci C, [place] JerseyPA-C 01/24/19 2047

## 2020-03-17 ENCOUNTER — Ambulatory Visit (HOSPITAL_COMMUNITY)
Admission: EM | Admit: 2020-03-17 | Discharge: 2020-03-17 | Disposition: A | Discharge status: Home / Self Care | Payer: Medicaid Other | Attending: Emergency Medicine | Admitting: Emergency Medicine

## 2020-03-17 ENCOUNTER — Other Ambulatory Visit: Payer: Self-pay

## 2020-03-17 ENCOUNTER — Encounter (HOSPITAL_COMMUNITY): Payer: Self-pay

## 2020-03-17 DIAGNOSIS — H60502 Unspecified acute noninfective otitis externa, left ear: Secondary | ICD-10-CM

## 2020-03-17 MED ORDER — NEOMYCIN-POLYMYXIN-HC 3.5-10000-1 OT SUSP: 3.0000 [drp] | Freq: Three times a day (TID) | OTIC | 0 refills | Status: AC

## 2020-03-17 MED ORDER — IBUPROFEN 800 MG PO TABS: 800.0000 mg | ORAL_TABLET | Freq: Three times a day (TID) | ORAL | 0 refills | Status: DC | PRN

## 2020-03-17 MED ORDER — AMOXICILLIN-POT CLAVULANATE 875-125 MG PO TABS: 1.0000 | ORAL_TABLET | Freq: Two times a day (BID) | ORAL | 0 refills | Status: AC

## 2020-03-17 NOTE — ED Provider Notes (Signed)
[facility]    CSN: [id number] Arrival date & time: 03/17/20  0931      History   Chief Complaint Chief Complaint  Patient presents with  . Otalgia    HPI Virginia Guerrero is a 39 y.o. female.   Virginia Guerrero presents with complaints of left ear pain. Sensation of a "cold" in her ear. Yesterday developed a cough, coughing causes an uncomfortable sensation to her ear. Has tried putting peroxide in her ear which hasn't helped. Symptoms started three weeks ago but worse over the past week. No fevers. Decreased hearing from left ear. She does wear her headset at work to the left ear. Has had similar multiple times in the past.    ROS per HPI, negative if not otherwise mentioned.      Past Medical History:  Diagnosis Date  . Abortion, nontherapeutic    in a clinic  . Carpal tunnel syndrome   . Hx of chest pain   . Infections of genitourinary tract in pregnancy, unspecified as to episode of care(646.60)   . No pertinent past medical history   . Painful respiration    costochondral pain  . Syncope and collapse     Patient Active Problem List   Diagnosis Date Noted  . Hypertension 05/05/2017  . S/P cesarean section 11/07/2016  . Postpartum anemia 06/20/2011  . Anxiety and depression 06/20/2011    Past Surgical History:  Procedure Laterality Date  . CESAREAN SECTION N/A 11/17/2012   Procedure: CESAREAN SECTION;  Surgeon: Lenoard Aden, MD;  Location: [facility];  Service: Obstetrics;  Laterality: N/A;  . CESAREAN SECTION N/A 03/28/2017   Procedure: CESAREAN SECTION;  Surgeon: Adam Phenix, MD;  Location: [facility];  Service: Obstetrics;  Laterality: N/A;  . INDUCED ABORTION     in a clinic    OB History    Gravida  4   Para  3   Term  3   Preterm  0   AB  1   Living  3     SAB  0   TAB  1   Ectopic  0   Multiple  0   Live Births  3            Home Medications    Prior to Admission medications   Medication Sig Start  Date End Date Taking? Authorizing Provider  amoxicillin-clavulanate (AUGMENTIN) 875-125 MG tablet Take 1 tablet by mouth every 12 (twelve) hours for 5 days. 03/17/20 03/22/20  Georgetta Haber, NP  ibuprofen (ADVIL) 800 MG tablet Take 1 tablet (800 mg total) by mouth every 8 (eight) hours as needed. 03/17/20   Georgetta Haber, NP  neomycin-polymyxin-hydrocortisone (CORTISPORIN) 3.5-10000-1 OTIC suspension Place 3 drops into the left ear 3 (three) times daily for 7 days. 03/17/20 03/24/20  Georgetta Haber, NP  Prenatal Vit-Fe Fumarate-FA (MULTIVITAMIN-PRENATAL) 27-0.8 MG TABS tablet Take 1 tablet by mouth daily at 12 noon.    [provider]  amLODipine (NORVASC) 5 MG tablet Take 1 tablet (5 mg total) by mouth daily. Patient not taking: Reported on 01/24/2019 03/31/17 01/24/19  Anyanwu, Jethro Bastos, MD  ferrous sulfate (FERROUSUL) 325 (65 FE) MG tablet Take 1 tablet (325 mg total) by mouth 2 (two) times daily. Patient not taking: Reported on 01/24/2019 03/31/17 01/24/19  Tereso Newcomer, MD    Family History Family History  Problem Relation Age of Onset  . Arthritis Other   . Lung cancer Father   .  Cancer Father        nasopharygeal  . Cancer Paternal Uncle        pancreatic  . Hypertension Maternal Grandfather   . Cancer Paternal Grandmother        breast  . Hypertension Mother   . Cancer Maternal Grandmother        lymphoma    Social History Social History   Tobacco Use  . Smoking status: Never Smoker  . Smokeless tobacco: Never Used  Substance Use Topics  . Alcohol use: No    Comment: not with pregnancy  . Drug use: No    Types: Marijuana    Comment: not since pregnancy     Allergies   Latex   Review of Systems Review of Systems   Physical Exam Triage Vital Signs ED Triage Vitals  Enc Vitals Group     BP 03/17/20 1130 (!) 159/91     Pulse Rate 03/17/20 1130 66     Resp 03/17/20 1130 17     Temp 03/17/20 1130 98.6 F (37 C)     Temp Source 03/17/20 1130  Oral     SpO2 03/17/20 1130 100 %     Weight --      Height --      Head Circumference --      Peak Flow --      Pain Score 03/17/20 1128 10     Pain Loc --      Pain Edu? --      Excl. in GC? --    No data found.  Updated Vital Signs BP (!) 159/91 (BP Location: Right Arm)   Pulse 66   Temp 98.6 F (37 C) (Oral)   Resp 17   LMP  (Within Weeks) Comment: 3 weeks  SpO2 100%   Physical Exam Constitutional:      General: She is not in acute distress.    Appearance: She is well-developed.  HENT:     Head: Normocephalic and atraumatic.     Left Ear: Drainage and tenderness present. A middle ear effusion is present.     Ears:     Comments: White discharge within left canal with tenderness  Cardiovascular:     Rate and Rhythm: Normal rate.  Pulmonary:     Effort: Pulmonary effort is normal.  Skin:    General: Skin is warm and dry.  Neurological:     Mental Status: She is alert and oriented to person, place, and time.      UC Treatments / Results  Labs (all labs ordered are listed, but only abnormal results are displayed) Labs Reviewed - No data to display  EKG   Radiology No results found.  Procedures Procedures (including critical care time)  Medications Ordered in UC Medications - No data to display  Initial Impression / Assessment and Plan / UC Course  I have reviewed the triage vital signs and the nursing notes.  Pertinent labs & imaging results that were available during my care of the patient were reviewed by me and considered in my medical decision making (see chart for details).     Oral and topical antibiotics provided, expected course and prevention discussed. Patient verbalized understanding and agreeable to plan.   Final Clinical Impressions(s) / UC Diagnoses   Final diagnoses:  Acute otitis externa of left ear, unspecified type   Discharge Instructions   None    ED Prescriptions    Medication Sig Dispense Auth. Provider  neomycin-polymyxin-hydrocortisone (CORTISPORIN) 3.5-10000-1 OTIC suspension Place 3 drops into the left ear 3 (three) times daily for 7 days. 10 mL Linus Mako B, NP   amoxicillin-clavulanate (AUGMENTIN) 875-125 MG tablet Take 1 tablet by mouth every 12 (twelve) hours for 5 days. 10 tablet Linus Mako B, NP   ibuprofen (ADVIL) 800 MG tablet Take 1 tablet (800 mg total) by mouth every 8 (eight) hours as needed. 30 tablet Georgetta Haber, NP     PDMP not reviewed this encounter.   Georgetta Haber, NP 03/17/20 1547

## 2020-03-17 NOTE — ED Triage Notes (Signed)
Pt presents with left ear pain and itchiness x 2-3 weeks. Denies fever, sob, cough.

## 2020-09-17 ENCOUNTER — Other Ambulatory Visit: Payer: Self-pay

## 2020-09-17 ENCOUNTER — Encounter (HOSPITAL_COMMUNITY): Payer: Self-pay | Admitting: Emergency Medicine

## 2020-09-17 ENCOUNTER — Ambulatory Visit (HOSPITAL_COMMUNITY)
Admission: EM | Admit: 2020-09-17 | Discharge: 2020-09-17 | Disposition: A | Discharge status: Home / Self Care | Payer: Medicaid Other | Attending: Urgent Care | Admitting: Urgent Care

## 2020-09-17 DIAGNOSIS — H6690 Otitis media, unspecified, unspecified ear: Secondary | ICD-10-CM | POA: Diagnosis not present

## 2020-09-17 DIAGNOSIS — H9202 Otalgia, left ear: Secondary | ICD-10-CM

## 2020-09-17 DIAGNOSIS — H9212 Otorrhea, left ear: Secondary | ICD-10-CM

## 2020-09-17 MED ORDER — PSEUDOEPHEDRINE HCL 60 MG PO TABS: 60.0000 mg | ORAL_TABLET | Freq: Three times a day (TID) | ORAL | 0 refills | Status: DC | PRN

## 2020-09-17 MED ORDER — CETIRIZINE HCL 10 MG PO TABS: 10.0000 mg | ORAL_TABLET | Freq: Every day | ORAL | 0 refills | Status: DC

## 2020-09-17 MED ORDER — AMOXICILLIN 875 MG PO TABS: 875.0000 mg | ORAL_TABLET | Freq: Two times a day (BID) | ORAL | 0 refills | Status: DC

## 2020-09-17 NOTE — ED Triage Notes (Signed)
Left ear pain that started 3 days ago.  History of the same.  Denies cough, denies runny nose

## 2020-09-17 NOTE — ED Provider Notes (Signed)
[facility]   MRN: [id number] DOB: [birth year]  Subjective:   Virginia Guerrero is a 40 y.o. female presenting for 3-day history of acute onset left ear pain, drainage.  Has started to have tinnitus and radiation of her pain up and down the left side of her face.  Denies fever, runny or stuffy nose, sore throat, cough.  She has used Tylenol and ibuprofen with minimal relief.  No current facility-administered medications for this encounter.  Current Outpatient Medications:  .  Prenatal Vit-Fe Fumarate-FA (MULTIVITAMIN-PRENATAL) 27-0.8 MG TABS tablet, Take 1 tablet by mouth daily at 12 noon., Disp: , Rfl:  .  ibuprofen (ADVIL) 800 MG tablet, Take 1 tablet (800 mg total) by mouth every 8 (eight) hours as needed., Disp: 30 tablet, Rfl: 0   Allergies  Allergen Reactions  . Latex Itching    condoms    Past Medical History:  Diagnosis Date  . Abortion, nontherapeutic    in a clinic  . Carpal tunnel syndrome   . Hx of chest pain   . Infections of genitourinary tract in pregnancy, unspecified as to episode of care(646.60)   . No pertinent past medical history   . Painful respiration    costochondral pain  . Syncope and collapse      Past Surgical History:  Procedure Laterality Date  . CESAREAN SECTION N/A 11/17/2012   Procedure: CESAREAN SECTION;  Surgeon: Lenoard Aden, MD;  Location: [facility];  Service: Obstetrics;  Laterality: N/A;  . CESAREAN SECTION N/A 03/28/2017   Procedure: CESAREAN SECTION;  Surgeon: Adam Phenix, MD;  Location: [facility];  Service: Obstetrics;  Laterality: N/A;  . INDUCED ABORTION     in a clinic    Family History  Problem Relation Age of Onset  . Arthritis Other   . Lung cancer Father   . Cancer Father        nasopharygeal  . Cancer Paternal Uncle        pancreatic  . Hypertension Maternal Grandfather   . Cancer Paternal Grandmother        breast  . Hypertension Mother   . Cancer Maternal Grandmother         lymphoma    Social History   Tobacco Use  . Smoking status: Never Smoker  . Smokeless tobacco: Never Used  Substance Use Topics  . Alcohol use: Not Currently    Comment: not with pregnancy  . Drug use: Not Currently    Types: Marijuana    Comment: not since pregnancy    ROS   Objective:   Vitals: BP 132/90 (BP Location: Right Arm)   Pulse 82   Temp 97.8 F (36.6 C) (Oral)   Resp 20   LMP 09/07/2020   SpO2 100%   Physical Exam Constitutional:      General: She is not in acute distress.    Appearance: She is well-developed. She is not ill-appearing.  HENT:     Head: Normocephalic and atraumatic.     Right Ear: Tympanic membrane, ear canal and external ear normal. No drainage or tenderness. No middle ear effusion. There is no impacted cerumen. Tympanic membrane is not erythematous.     Left Ear: Ear canal and external ear normal. No drainage or tenderness.  No middle ear effusion. There is no impacted cerumen. Tympanic membrane is not erythematous.     Ears:     Comments: Bulging and erythematous left TM.    Nose: No congestion  or rhinorrhea.     Mouth/Throat:     Mouth: Mucous membranes are moist. No oral lesions.     Pharynx: No pharyngeal swelling, oropharyngeal exudate, posterior oropharyngeal erythema or uvula swelling.     Tonsils: No tonsillar exudate or tonsillar abscesses.  Eyes:     General: No scleral icterus.       Right eye: No discharge.        Left eye: No discharge.     Extraocular Movements: Extraocular movements intact.     Right eye: Normal extraocular motion.     Left eye: Normal extraocular motion.     Conjunctiva/sclera: Conjunctivae normal.     Pupils: Pupils are equal, round, and reactive to light.  Cardiovascular:     Rate and Rhythm: Normal rate.  Pulmonary:     Effort: Pulmonary effort is normal.  Musculoskeletal:     Cervical back: Normal range of motion and neck supple.  Lymphadenopathy:     Cervical: No cervical adenopathy.   Skin:    General: Skin is warm and dry.  Neurological:     General: No focal deficit present.     Mental Status: She is alert and oriented to person, place, and time.  Psychiatric:        Mood and Affect: Mood normal.        Behavior: Behavior normal.        Thought Content: Thought content normal.        Judgment: Judgment normal.      Assessment and Plan :   PDMP not reviewed this encounter.  1. Acute otitis media, unspecified otitis media type   2. Left ear pain   3. Ear drainage, left     Start amoxicillin to cover for otitis media. Use supportive care otherwise. Counseled patient on potential for adverse effects with medications prescribed/recommended today, ER and return-to-clinic precautions discussed, patient verbalized understanding.    Wallis Bamberg, [place] 09/17/20 [id number]

## 2021-03-06 ENCOUNTER — Emergency Department (HOSPITAL_COMMUNITY)
Admission: EM | Admit: 2021-03-06 | Discharge: 2021-03-06 | Discharge status: Against Medical Advice | Payer: Medicaid Other

## 2021-03-06 ENCOUNTER — Emergency Department (HOSPITAL_BASED_OUTPATIENT_CLINIC_OR_DEPARTMENT_OTHER)
Admission: EM | Admit: 2021-03-06 | Discharge: 2021-03-07 | Disposition: A | Discharge status: Home / Self Care | Payer: Medicaid Other | Attending: Emergency Medicine | Admitting: Emergency Medicine

## 2021-03-06 ENCOUNTER — Other Ambulatory Visit: Payer: Self-pay

## 2021-03-06 ENCOUNTER — Encounter (HOSPITAL_BASED_OUTPATIENT_CLINIC_OR_DEPARTMENT_OTHER): Payer: Self-pay | Admitting: *Deleted

## 2021-03-06 DIAGNOSIS — I10 Essential (primary) hypertension: Secondary | ICD-10-CM | POA: Insufficient documentation

## 2021-03-06 DIAGNOSIS — R0989 Other specified symptoms and signs involving the circulatory and respiratory systems: Secondary | ICD-10-CM | POA: Diagnosis not present

## 2021-03-06 DIAGNOSIS — Z91040 Latex allergy status: Secondary | ICD-10-CM | POA: Diagnosis not present

## 2021-03-06 NOTE — ED Notes (Signed)
Called for triage vitals x1 with no response

## 2021-03-06 NOTE — ED Triage Notes (Signed)
States she has had a bug in her throat for a week. He is walking across her "windpipe".

## 2021-03-07 NOTE — ED Provider Notes (Signed)
[facility] EMERGENCY DEPARTMENT Provider Note   CSN: [id number] Arrival date & time: 03/06/21  2013     History No chief complaint on file.   Virginia Guerrero is a 40 y.o. female.  Patient states that she has a bug in her throat.  She states that it [place] through her nose.  She states is been about 5 days.  Sometimes goes asleep but sometimes is late.  She states that currently it is sleeping.  All of a sudden her voice gets hoarse and she says it must be awake.  She did not see any bugs crawling her nose or mouth.  She has not had any coughing.  Has not had any nausea or vomiting.  She states she talk to somebody on the phone who told her to drink Listerine that should help but it did not.       Past Medical History:  Diagnosis Date   Abortion, nontherapeutic    in a clinic   Carpal tunnel syndrome    Hx of chest pain    Infections of genitourinary tract in pregnancy, unspecified as to episode of care(646.60)    No pertinent past medical history    Painful respiration    costochondral pain   Syncope and collapse     Patient Active Problem List   Diagnosis Date Noted   Hypertension 05/05/2017   S/P cesarean section 11/07/2016   Postpartum anemia 06/20/2011   Anxiety and depression 06/20/2011    Past Surgical History:  Procedure Laterality Date   CESAREAN SECTION N/A 11/17/2012   Procedure: CESAREAN SECTION;  Surgeon: Lenoard Aden, MD;  Location: [facility];  Service: Obstetrics;  Laterality: N/A;   CESAREAN SECTION N/A 03/28/2017   Procedure: CESAREAN SECTION;  Surgeon: Adam Phenix, MD;  Location: [facility];  Service: Obstetrics;  Laterality: N/A;   INDUCED ABORTION     in a clinic     OB History     Gravida  4   Para  3   Term  3   Preterm  0   AB  1   Living  3      SAB  0   IAB  1   Ectopic  0   Multiple  0   Live Births  3           Family History  Problem Relation Age of Onset   Arthritis Other    Lung  cancer Father    Cancer Father        nasopharygeal   Cancer Paternal Uncle        pancreatic   Hypertension Maternal Grandfather    Cancer Paternal Grandmother        breast   Hypertension Mother    Cancer Maternal Grandmother        lymphoma    Social History   Tobacco Use   Smoking status: Never   Smokeless tobacco: Never  Substance Use Topics   Alcohol use: Yes    Comment: not with pregnancy   Drug use: Not Currently    Types: Marijuana    Comment: not since pregnancy    Home Medications Prior to Admission medications   Medication Sig Start Date End Date Taking? Authorizing Provider  Prenatal Vit-Fe Fumarate-FA (MULTIVITAMIN-PRENATAL) 27-0.8 MG TABS tablet Take 1 tablet by mouth daily at 12 noon.   Yes [provider]  amoxicillin (AMOXIL) 875 MG tablet Take 1 tablet (875 mg total) by mouth 2 (  two) times daily. 09/17/20   Wallis Bamberg, PA-C  cetirizine (ZYRTEC ALLERGY) 10 MG tablet Take 1 tablet (10 mg total) by mouth daily. 09/17/20   Wallis Bamberg, PA-C  ibuprofen (ADVIL) 800 MG tablet Take 1 tablet (800 mg total) by mouth every 8 (eight) hours as needed. 03/17/20   Georgetta Haber, NP  pseudoephedrine (SUDAFED) 60 MG tablet Take 1 tablet (60 mg total) by mouth every 8 (eight) hours as needed for congestion. 09/17/20   Wallis Bamberg, PA-C  amLODipine (NORVASC) 5 MG tablet Take 1 tablet (5 mg total) by mouth daily. Patient not taking: Reported on 01/24/2019 03/31/17 01/24/19  Anyanwu, Jethro Bastos, MD  ferrous sulfate (FERROUSUL) 325 (65 FE) MG tablet Take 1 tablet (325 mg total) by mouth 2 (two) times daily. Patient not taking: Reported on 01/24/2019 03/31/17 01/24/19  Tereso Newcomer, MD    Allergies    Latex  Review of Systems   Review of Systems  All other systems reviewed and are negative.  Physical Exam Updated Vital Signs BP 127/88 (BP Location: Right Arm)   Pulse 68   Temp 98 F (36.7 C)   Resp 18   Ht 5\' 5"  (1.651 m)   Wt 89.8 kg   LMP 02/20/2021   SpO2  100%   BMI 32.95 kg/m   Physical Exam Vitals and nursing note reviewed.  Constitutional:      Appearance: She is well-developed.  HENT:     Head: Normocephalic and atraumatic.     Right Ear: Tympanic membrane normal.     Left Ear: Tympanic membrane normal.     Mouth/Throat:     Mouth: Mucous membranes are moist.  Eyes:     Pupils: Pupils are equal, round, and reactive to light.  Cardiovascular:     Rate and Rhythm: Normal rate and regular rhythm.  Pulmonary:     Effort: No respiratory distress.     Breath sounds: No stridor.  Abdominal:     General: Abdomen is flat. There is no distension.  Musculoskeletal:        General: No swelling or tenderness. Normal range of motion.     Cervical back: Normal range of motion.  Skin:    General: Skin is warm and dry.  Neurological:     General: No focal deficit present.     Mental Status: She is alert.    ED Results / Procedures / Treatments   Labs (all labs ordered are listed, but only abnormal results are displayed) Labs Reviewed - No data to display  EKG None  Radiology No results found.  Procedures Procedures   Medications Ordered in ED Medications - No data to display  ED Course  I have reviewed the triage vital signs and the nursing notes.  Pertinent labs & imaging results that were available during my care of the patient were reviewed by me and considered in my medical decision making (see chart for details).    MDM Rules/Calculators/A&P                           No obvious foreign body.  No indication for imaging.  I discussed with her that it would be unlikely for bugs to be in her throat that long.  This made her upset.  We will refer her to otolaryngology.  Final Clinical Impression(s) / ED Diagnoses Final diagnoses:  Sensation of foreign body in throat    Rx / DC Orders  ED Discharge Orders          Ordered    Ambulatory referral to ENT        03/07/21 0111             Murle Otting, Barbara Cower,  MD 03/07/21 [phone]

## 2021-03-07 NOTE — ED Notes (Signed)
Pt tearful at discharge. Did not let RN speak or do discharge vitals. Pt states, "I just need my stuff. I just want to get the fuck up out of here." pt walked out of ED

## 2021-03-16 ENCOUNTER — Other Ambulatory Visit: Payer: Self-pay

## 2021-03-16 ENCOUNTER — Encounter: Payer: Self-pay | Admitting: Student

## 2021-03-16 ENCOUNTER — Ambulatory Visit (INDEPENDENT_AMBULATORY_CARE_PROVIDER_SITE_OTHER): Payer: Medicaid Other | Admitting: Student

## 2021-03-16 VITALS — BP 140/88 | HR 88 | Temp 98.4°F | Ht 64.5 in | Wt 190.0 lb

## 2021-03-16 DIAGNOSIS — T17908S Unspecified foreign body in respiratory tract, part unspecified causing other injury, sequela: Secondary | ICD-10-CM | POA: Diagnosis not present

## 2021-03-16 DIAGNOSIS — H6690 Otitis media, unspecified, unspecified ear: Secondary | ICD-10-CM | POA: Diagnosis not present

## 2021-03-16 NOTE — Progress Notes (Signed)
Synopsis: Referred forbug in her throat by No ref. provider found  Subjective:   PATIENT ID: Virginia Guerrero GENDER: female DOB: 06/05/1981, MRN: [id number]  Chief Complaint  Patient presents with   Consult    Referred by ENT, states 03/02/21 swallowed a bug, this weekend swallowed popcorn and feels it still in throat, chest/lungs feels pitches, coughing dark brown phelgm    39yF seen recently in ED 9/7 for 'bug in her throat' with benign exam and referred to pulmonary and ENT.  She says she felt a beetle pass thru her nose and felt it walk around in her chest and throat for about 9 days. She says 20-Oct-2022 morning he was dead and felt him decomposing and saw her veins in her arms turn blue, also had a fever. Then felt 'acidic' sensation in the blood. She didn't cough it up. She then got a popcorn kernel stuck she thinks at level of vocal cords on Tuesday. She has some residual sorenss she says under bilateral clavicle. She only has a cough when she feels the kernel move over her vocal cords. She has no shortness of breath.    She was seen by [facility] ENT where she endorsed self-induced vomiting due to above sensation. Their FNL remarkable only for moderate interarytenoid edema/erythema suggestive of LPR. Suggested ppi, stop wretching.  Otherwise pertinent review of systems is negative.  No family history of lung issues.   She sings pretty much every genre professionally. Works for [employer].   Past Medical History:  Diagnosis Date   Abortion, nontherapeutic    in a clinic   Carpal tunnel syndrome    Hx of chest pain    Infections of genitourinary tract in pregnancy, unspecified as to episode of care(646.60)    No pertinent past medical history    Painful respiration    costochondral pain   Syncope and collapse      Family History  Problem Relation Age of Onset   Arthritis Other    Lung cancer Father    Cancer Father        nasopharygeal   Cancer Paternal Uncle        pancreatic    Hypertension Maternal Grandfather    Cancer Paternal Grandmother        breast   Hypertension Mother    Cancer Maternal Grandmother        lymphoma     Past Surgical History:  Procedure Laterality Date   CESAREAN SECTION N/A 11/17/2012   Procedure: CESAREAN SECTION;  Surgeon: Lenoard Aden, MD;  Location: [facility];  Service: Obstetrics;  Laterality: N/A;   CESAREAN SECTION N/A 03/28/2017   Procedure: CESAREAN SECTION;  Surgeon: Adam Phenix, MD;  Location: [facility]IRTH[facility];  Service: Obstetrics;  Laterality: N/A;   INDUCED ABORTION     in a clinic    Social History   Socioeconomic History   Marital status: Single    Spouse name: Not on file   Number of children: Not on file   Years of education: Not on file   Highest education level: Not on file  Occupational History   Not on file  Tobacco Use   Smoking status: Never   Smokeless tobacco: Never  Substance and Sexual Activity   Alcohol use: Yes    Comment: not with pregnancy   Drug use: Not Currently    Types: Marijuana    Comment: not since pregnancy   Sexual activity: Not Currently    Birth control/protection:  None  Other Topics Concern   Not on file  Social History Narrative   Not on file   Social Determinants of Health   Financial Resource Strain: Not on file  Food Insecurity: Not on file  Transportation Needs: Not on file  Physical Activity: Not on file  Stress: Not on file  Social Connections: Not on file  Intimate Partner Violence: Not on file     Allergies  Allergen Reactions   Latex Itching    condoms     Outpatient Medications Prior to Visit  Medication Sig Dispense Refill   amoxicillin (AMOXIL) 875 MG tablet Take 1 tablet (875 mg total) by mouth 2 (two) times daily. 14 tablet 0   cetirizine (ZYRTEC ALLERGY) 10 MG tablet Take 1 tablet (10 mg total) by mouth daily. 30 tablet 0   ibuprofen (ADVIL) 800 MG tablet Take 1 tablet (800 mg total) by mouth every 8 (eight) hours as needed. 30  tablet 0   Prenatal Vit-Fe Fumarate-FA (MULTIVITAMIN-PRENATAL) 27-0.8 MG TABS tablet Take 1 tablet by mouth daily at 12 noon.     pseudoephedrine (SUDAFED) 60 MG tablet Take 1 tablet (60 mg total) by mouth every 8 (eight) hours as needed for congestion. 30 tablet 0   No facility-administered medications prior to visit.       Objective:   Physical Exam:  General appearance: 40 y.o., female, NAD, conversant  Eyes: anicteric sclerae, moist conjunctivae; no lid-lag; PERRL, tracking appropriately HENT: NCAT; oropharynx, MMM, no mucosal ulcerations; normal hard and soft palate Neck: Trachea midline; no lymphadenopathy, no JVD Lungs: CTAB, no crackles, no wheeze, with normal respiratory effort CV: RRR, no MRGs  Abdomen: Soft, non-tender; non-distended, BS present  Extremities: No peripheral edema, radial and DP pulses present bilaterally  Skin: Normal temperature, turgor and texture; no rash Psych: Appropriate affect Neuro: Alert and oriented to person and place, no focal deficit    Vitals:   03/16/21 1333  BP: 140/88  Pulse: 88  Temp: 98.4 F (36.9 C)  TempSrc: Oral  SpO2: 100%  Weight: 190 lb (86.2 kg)  Height: 5' 4.5" (1.638 m)   100% on RA BMI Readings from Last 3 Encounters:  03/16/21 32.11 kg/m  03/06/21 32.95 kg/m  05/05/17 33.50 kg/m   Wt Readings from Last 3 Encounters:  03/16/21 190 lb (86.2 kg)  03/06/21 198 lb (89.8 kg)  05/05/17 201 lb 4.8 oz (91.3 kg)     CBC    Component Value Date/Time   WBC 15.7 (H) 03/29/2017 0543   RBC 3.25 (L) 03/29/2017 0543   HGB 8.8 (L) 03/29/2017 0543   HGB 10.1 (L) 01/08/2017 1004   HCT 26.2 (L) 03/29/2017 0543   HCT 32.5 (L) 01/08/2017 1004   PLT 196 03/29/2017 0543   PLT 272 01/08/2017 1004   MCV 80.6 03/29/2017 0543   MCV 81 01/08/2017 1004   MCH 27.1 03/29/2017 0543   MCHC 33.6 03/29/2017 0543   RDW 15.7 (H) 03/29/2017 0543   RDW 14.8 01/08/2017 1004   LYMPHSABS 2.2 11/07/2016 1532   MONOABS 0.5 09/11/2016  1316   EOSABS 0.1 11/07/2016 1532   BASOSABS 0.0 11/07/2016 1532      Chest Imaging: none  Pulmonary Functions Testing Results: No flowsheet data found.      Assessment & Plan:   # Globus sensation:  # Possible aspiration event: bug, kernel  # LPR:  Plan: - discussed options including watchful waiting, trial of ppi, ct neck/chest. She desires high degree of certainty  there is no evidence of lung damage/atelectasis caused by aspirated kernel or bug despite the low likelihood of finding anything. Will obtain ct neck/chest and call to discuss results.   I spent 46 minutes dedicated to the care of this patient on the date of this encounter to include pre-visit review of records, face-to-face time with the patient discussing conditions above, post visit ordering of testing, clinical documentation with the electronic health record, making appropriate referrals as documented, and communicating necessary findings to members of the patients care team.        Omar Person, MD [facility] 03/16/2021 2:01 PM

## 2021-03-16 NOTE — Patient Instructions (Signed)
-   ct neck and chest will be scheduled soon, I'll be in touch with results to discuss

## 2021-03-22 ENCOUNTER — Telehealth: Payer: Self-pay | Admitting: Student

## 2021-03-22 NOTE — Telephone Encounter (Signed)
Virginia Person, MD to Virginia Guerrero, CMA     2:18 PM  Although her examinations and workup so far have been reassuring, if she's having trouble breathing - like having trouble completing sentences, or if she's having trouble managing secretions then she should head to the ED. Otherwise she has ct neck and chest coming up 9/26.   I called and spoke with the pt and notified her of the above recommendations from Dr Thora Lance. She started to say something and then her phone disconnected. I called her right back and went to VM- LMTCB.

## 2021-03-22 NOTE — Telephone Encounter (Signed)
Called and spoke with pt and she stated that she knows that there is a bug still in her vocal cords.  She stated that she can feel the popcorn hull in her throat as well and she feels that there is inflammation in her throat.  She is having a hard time with breathing at night and coughing.  She stated that something has to be done about this.  NM please advise. Thanks

## 2021-03-23 ENCOUNTER — Telehealth: Payer: Self-pay | Admitting: Student

## 2021-03-23 DIAGNOSIS — T17908D Unspecified foreign body in respiratory tract, part unspecified causing other injury, subsequent encounter: Secondary | ICD-10-CM

## 2021-03-26 ENCOUNTER — Ambulatory Visit (HOSPITAL_COMMUNITY): Payer: Medicaid Other

## 2021-03-26 NOTE — Telephone Encounter (Signed)
Waiting on ins to respond and approve then appts with be rescheduled lmom to let pt be aware of this Tobe Sos

## 2021-03-27 NOTE — Telephone Encounter (Signed)
Message will be sent to Dr Thora Lance to do a peer to peer [phone] this is a MD# tracking# is [id number] thanks for your help libby

## 2021-03-30 ENCOUNTER — Other Ambulatory Visit: Payer: Medicaid Other

## 2021-04-04 ENCOUNTER — Ambulatory Visit: Payer: Medicaid Other

## 2021-04-04 ENCOUNTER — Other Ambulatory Visit: Payer: Medicaid Other

## 2021-04-04 NOTE — Telephone Encounter (Signed)
Dr Thora Lance try this # for the peer to peer [phone] thanks libby

## 2021-04-04 NOTE — Telephone Encounter (Signed)
Can you let her know they want CXR first, which sounds reasonable to me. I have ordered chest x-ray.

## 2023-01-29 ENCOUNTER — Other Ambulatory Visit: Payer: Self-pay

## 2023-01-29 ENCOUNTER — Ambulatory Visit (INDEPENDENT_AMBULATORY_CARE_PROVIDER_SITE_OTHER): Payer: Medicaid Other | Admitting: *Deleted

## 2023-01-29 VITALS — BP 128/84 | HR 75 | Ht 64.5 in | Wt 182.9 lb

## 2023-01-29 DIAGNOSIS — Z3201 Encounter for pregnancy test, result positive: Secondary | ICD-10-CM

## 2023-01-29 DIAGNOSIS — Z3200 Encounter for pregnancy test, result unknown: Secondary | ICD-10-CM

## 2023-01-29 LAB — POCT PREGNANCY, URINE: Preg Test, Ur: POSITIVE — AB

## 2023-01-29 NOTE — Progress Notes (Signed)
Pt presents for pregnancy test which is positive.  She is excited about the pregnancy. Pt denies vaginal bleeding or abdominal cramping. She reports sure LMP 10/31/22 which yields EDD 08/07/23, now 12w 6d. Pt has Hx of C/S x2 - last prenatal care in 2018 @ [facility]. She will schedule prenatal care in this office. She was advised to go to [facility] if she develops heavy vaginal bleeding or severe abdominal cramping.

## 2023-02-19 ENCOUNTER — Telehealth (INDEPENDENT_AMBULATORY_CARE_PROVIDER_SITE_OTHER): Payer: Medicaid Other

## 2023-02-19 DIAGNOSIS — Z3689 Encounter for other specified antenatal screening: Secondary | ICD-10-CM

## 2023-02-19 DIAGNOSIS — O09529 Supervision of elderly multigravida, unspecified trimester: Secondary | ICD-10-CM | POA: Insufficient documentation

## 2023-02-19 DIAGNOSIS — Z98891 History of uterine scar from previous surgery: Secondary | ICD-10-CM

## 2023-02-19 DIAGNOSIS — O0990 Supervision of high risk pregnancy, unspecified, unspecified trimester: Secondary | ICD-10-CM

## 2023-02-19 DIAGNOSIS — O09522 Supervision of elderly multigravida, second trimester: Secondary | ICD-10-CM

## 2023-02-19 DIAGNOSIS — Z8759 Personal history of other complications of pregnancy, childbirth and the puerperium: Secondary | ICD-10-CM

## 2023-02-19 MED ORDER — BLOOD PRESSURE KIT DEVI: 1.0000 | Freq: Once | 0 refills | Status: AC

## 2023-02-19 NOTE — Progress Notes (Signed)
New OB Intake  I connected with Virginia Guerrero  on 02/19/23 at  8:15 AM EDT by MyChart Video Visit and verified that I am speaking with the correct person using two identifiers. Nurse is located at [facility] and pt is located at home.  I discussed the limitations, risks, security and privacy concerns of performing an evaluation and management service by telephone and the availability of in person appointments. I also discussed with the patient that there may be a patient responsible charge related to this service. The patient expressed understanding and agreed to proceed.  I explained I am completing New OB Intake today. We discussed EDD of 08/07/23 by sure LMP. Pt is [name]. I reviewed her allergies, medications and Medical/Surgical/OB history.    Patient Active Problem List   Diagnosis Date Noted   Supervision of high risk pregnancy, antepartum 02/19/2023   AMA (advanced maternal age) multigravida 35+ 02/19/2023   Hypertension 05/05/2017   History of cesarean section 11/07/2016   Anxiety and depression 06/20/2011   Concerns addressed today Reports fatigue and nausea during first trimester that has continued to improve. Doesn't feel she needs any medication for nausea at this time.  Delivery Plans Plans to deliver at [facility]. Discussed the nature of our practice with multiple providers including residents and students. Due to the size of the practice, the delivering provider may not be the same as those providing prenatal care. Not a water birth candidate.  MyChart/Babyscripts MyChart access verified. I explained pt will have some visits in office and some virtually. Babyscripts instructions given and order placed.  Blood Pressure Cuff/Weight Scale Blood pressure cuff ordered for patient to pick-up from [employer]. Explained after first prenatal appt pt will check weekly and document in Babyscripts. Patient does have weight scale.  Anatomy US Explained first scheduled [place] will be  around 19 weeks. Anatomy US scheduled for 03/13/23 at 0815.  Is patient a CenteringPregnancy candidate?  Declined Declined due to  appointment time too long  Is patient a Mom+Baby Combined Care candidate?  Not a candidate    Is patient a candidate for Babyscripts Optimization?  History of pre-eclampsia. However pt is interested in optimized schedule and plans to check BP at home. Review with provider at new OB.  First visit review I reviewed new OB appt with patient. Explained pt will be seen by Virginia Guerrero at first visit. Discussed Virginia Guerrero genetic screening with patient; desires Panorama and Horizon. Hemoglobinopathies previously negative, but no testing for CF or SMA. Will need CMP + Pr/Cr ratio due to history of pre-eclampsia. Reports history of hypertension but has made diet changes and has not had elevated BP in recent past to her knowledge. Will need Pap at first visit.   Last Pap NILM, neg HPV (11/07/2016)  Virginia Bicker, RN 02/19/2023  10:32 AM

## 2023-02-26 ENCOUNTER — Encounter: Payer: Self-pay | Admitting: Obstetrics and Gynecology

## 2023-02-26 ENCOUNTER — Ambulatory Visit: Payer: Medicaid Other | Admitting: Obstetrics and Gynecology

## 2023-02-26 ENCOUNTER — Other Ambulatory Visit: Payer: Self-pay

## 2023-02-26 ENCOUNTER — Other Ambulatory Visit (HOSPITAL_COMMUNITY)
Admission: RE | Admit: 2023-02-26 | Discharge: 2023-02-26 | Disposition: A | Discharge status: Home / Self Care | Payer: Medicaid Other | Source: Ambulatory Visit | Attending: Obstetrics and Gynecology | Admitting: Obstetrics and Gynecology

## 2023-02-26 VITALS — BP 124/87 | HR 86 | Wt 189.0 lb

## 2023-02-26 DIAGNOSIS — Z8759 Personal history of other complications of pregnancy, childbirth and the puerperium: Secondary | ICD-10-CM | POA: Diagnosis not present

## 2023-02-26 DIAGNOSIS — O0992 Supervision of high risk pregnancy, unspecified, second trimester: Secondary | ICD-10-CM

## 2023-02-26 DIAGNOSIS — Z3A16 16 weeks gestation of pregnancy: Secondary | ICD-10-CM | POA: Diagnosis not present

## 2023-02-26 DIAGNOSIS — O09522 Supervision of elderly multigravida, second trimester: Secondary | ICD-10-CM

## 2023-02-26 DIAGNOSIS — O0990 Supervision of high risk pregnancy, unspecified, unspecified trimester: Secondary | ICD-10-CM

## 2023-02-26 DIAGNOSIS — Z98891 History of uterine scar from previous surgery: Secondary | ICD-10-CM | POA: Diagnosis not present

## 2023-02-26 DIAGNOSIS — I10 Essential (primary) hypertension: Secondary | ICD-10-CM | POA: Diagnosis not present

## 2023-02-26 MED ORDER — ASPIRIN 81 MG PO TBEC: 81.0000 mg | DELAYED_RELEASE_TABLET | Freq: Every day | ORAL | 2 refills | Status: AC

## 2023-02-26 NOTE — Patient Instructions (Signed)

## 2023-02-26 NOTE — Progress Notes (Signed)
Subjective:    Virginia Guerrero is a [id number] [redacted]w[redacted]d being seen today for her first obstetrical visit.  Her obstetrical history is significant for advanced maternal age, obesity, pre-eclampsia, and cesarean section x 2 . Patient reports SOL with first pregnancy; eIOL at 39 weeks with second pregnancy which resulted in cesarean section due to NRFHT; Third pregnancy was a failed VBAC due to NRFHT.   Patient does intend to breast feed. Pregnancy history fully reviewed.  Patient reports no complaints.  Vitals:   02/26/23 1036  BP: 124/87  Pulse: 86  Weight: 189 lb (85.7 kg)    HISTORY: OB History  Gravida Para Term Preterm AB Living  5 3 3  0 1 3  SAB IAB Ectopic Multiple Live Births  0 1 0 0 3    # Outcome Date GA Lbr Len/2nd Weight Sex Type Anes PTL Lv  5 Current           4 Term 03/28/17 [redacted]w[redacted]d  6 lb 3.3 oz (2.815 kg) M CS-LTranv EPI  LIV  3 Term 11/17/12 [redacted]w[redacted]d 07:37 / 03:06 6 lb 11.8 oz (3.055 kg) F CS-LTranv EPI  LIV  2 Term 08/01/05 [redacted]w[redacted]d   F Vag-Spont EPI  LIV     Birth Comments: no complications,   1 IAB 2002           Past Medical History:  Diagnosis Date   Abortion, nontherapeutic    in a clinic   Carpal tunnel syndrome    Hx of chest pain    Infections of genitourinary tract in pregnancy, unspecified as to episode of care(646.60)    Painful respiration    costochondral pain   Syncope and collapse    Past Surgical History:  Procedure Laterality Date   CESAREAN SECTION N/A 11/17/2012   Procedure: CESAREAN SECTION;  Surgeon: Lenoard Aden, MD;  Location: [facility];  Service: Obstetrics;  Laterality: N/A;   CESAREAN SECTION N/A 03/28/2017   Procedure: CESAREAN SECTION;  Surgeon: Adam Phenix, MD;  Location: [facility] BIRTHING SUITES;  Service: Obstetrics;  Laterality: N/A;   INDUCED ABORTION     in a clinic   Family History  Problem Relation Age of Onset   Hypertension Mother    Lung cancer Father    Cancer Father        nasopharygeal   Cancer Maternal Grandmother         lymphoma   Lymphoma Maternal Grandmother    Hypertension Maternal Grandfather    Breast cancer Paternal Grandmother 22   Pancreatic cancer Paternal Uncle      Exam    Uterus:     Pelvic Exam:    Perineum: Normal Perineum   Vulva: normal   Vagina:  normal mucosa, normal discharge   pH:    Cervix: multiparous appearance and cervix is closed and long   Adnexa: no mass, fullness, tenderness   Bony Pelvis: gynecoid  System: Breast:  normal appearance, no masses or tenderness   Skin: normal coloration and turgor, no rashes    Neurologic: oriented, no focal deficits   Extremities: normal strength, tone, and muscle mass   HEENT extra ocular movement intact   Mouth/Teeth mucous membranes moist, pharynx normal without lesions and dental hygiene good   Neck supple and no masses   Cardiovascular: regular rate and rhythm   Respiratory:  appears well, vitals normal, no respiratory distress, acyanotic, normal RR   Abdomen: soft, non-tender; bowel sounds normal; no masses,  no organomegaly   Urinary:  Assessment:    Pregnancy: [id number] Patient Active Problem List   Diagnosis Date Noted   Supervision of high risk pregnancy, antepartum 02/19/2023   AMA (advanced maternal age) multigravida 35+ 02/19/2023   History of pre-eclampsia 02/19/2023   Hypertension 05/05/2017   History of cesarean section 11/07/2016   Anxiety and depression 06/20/2011        Plan:     Initial labs drawn. Prenatal vitamins. Problem list reviewed and updated. Genetic Screening discussed : panorama ordered.  Ultrasound discussed; fetal survey: ordered. Baseline labs ordered Rx ASA provided Patient is interested in another TOLAC if no labor  Follow up in 4 weeks. 50% of 30 min visit spent on counseling and coordination of care.     Virginia Guerrero 02/26/2023

## 2023-02-27 LAB — COMPREHENSIVE METABOLIC PANEL
ALT: 9 IU/L (ref 0–32)
AST: 14 IU/L (ref 0–40)
Albumin: 3.9 g/dL (ref 3.9–4.9)
Alkaline Phosphatase: 63 IU/L (ref 44–121)
BUN/Creatinine Ratio: 10 (ref 9–23)
BUN: 6 mg/dL (ref 6–20)
Bilirubin Total: 0.3 mg/dL (ref 0.0–1.2)
CO2: 20 mmol/L (ref 20–29)
Calcium: 9 mg/dL (ref 8.7–10.2)
Chloride: 103 mmol/L (ref 96–106)
Creatinine, Ser: 0.61 mg/dL (ref 0.57–1.00)
Globulin, Total: 3 g/dL (ref 1.5–4.5)
Glucose: 88 mg/dL (ref 70–99)
Potassium: 3.9 mmol/L (ref 3.5–5.2)
Sodium: 137 mmol/L (ref 134–144)
Total Protein: 6.9 g/dL (ref 6.0–8.5)
eGFR: 117 mL/min/{1.73_m2} (ref >59)

## 2023-02-27 LAB — HEMOGLOBIN A1C
Est. average glucose Bld gHb Est-mCnc: 123 mg/dL
Hgb A1c MFr Bld: 5.9 % — ABNORMAL HIGH (ref 4.8–5.6)

## 2023-03-06 ENCOUNTER — Telehealth: Payer: Self-pay | Admitting: *Deleted

## 2023-03-06 LAB — CYTOLOGY - PAP
Chlamydia: NEGATIVE
Comment: NEGATIVE
Comment: NEGATIVE
Comment: NEGATIVE
Comment: NORMAL
Diagnosis: NEGATIVE
High risk HPV: NEGATIVE
Neisseria Gonorrhea: NEGATIVE
Trichomonas: NEGATIVE

## 2023-03-06 LAB — HORIZON CUSTOM: REPORT SUMMARY: NEGATIVE

## 2023-03-06 LAB — PANORAMA PRENATAL TEST FULL PANEL:PANORAMA TEST PLUS 5 ADDITIONAL MICRODELETIONS: FETAL FRACTION: 5.8

## 2023-03-06 NOTE — Progress Notes (Signed)
Attempt to call patient to inform on results and need to schedule a early 2 hours GTT before next appt.   No answer and LVM.  Will try to call patient back again.   Felecia Shelling CMA

## 2023-03-06 NOTE — Telephone Encounter (Addendum)
-----   Message from [facility] sent at 02/27/2023  1:02 PM EDT ----- Please inform patient of abnormal diabetes screening test and need for early 2 hour glucola before her next appointment please  9/5  1615  Called pt and she did not answer. Message left on voicemail stating that I am calling to discuss test results and recommended care. Pt was asked to call back and leave message on nurse voicemail.

## 2023-03-11 DIAGNOSIS — O99210 Obesity complicating pregnancy, unspecified trimester: Secondary | ICD-10-CM | POA: Insufficient documentation

## 2023-03-13 ENCOUNTER — Ambulatory Visit: Payer: Medicaid Other | Attending: Obstetrics and Gynecology

## 2023-03-13 ENCOUNTER — Other Ambulatory Visit: Payer: Self-pay | Admitting: *Deleted

## 2023-03-13 ENCOUNTER — Other Ambulatory Visit: Payer: Self-pay

## 2023-03-13 ENCOUNTER — Ambulatory Visit: Payer: Medicaid Other

## 2023-03-13 DIAGNOSIS — O09292 Supervision of pregnancy with other poor reproductive or obstetric history, second trimester: Secondary | ICD-10-CM

## 2023-03-13 DIAGNOSIS — O99210 Obesity complicating pregnancy, unspecified trimester: Secondary | ICD-10-CM | POA: Diagnosis present

## 2023-03-13 DIAGNOSIS — O99810 Abnormal glucose complicating pregnancy: Secondary | ICD-10-CM

## 2023-03-13 DIAGNOSIS — O99212 Obesity complicating pregnancy, second trimester: Secondary | ICD-10-CM

## 2023-03-13 DIAGNOSIS — O34219 Maternal care for unspecified type scar from previous cesarean delivery: Secondary | ICD-10-CM | POA: Diagnosis not present

## 2023-03-13 DIAGNOSIS — O0990 Supervision of high risk pregnancy, unspecified, unspecified trimester: Secondary | ICD-10-CM

## 2023-03-13 DIAGNOSIS — Z3A19 19 weeks gestation of pregnancy: Secondary | ICD-10-CM

## 2023-03-13 DIAGNOSIS — E669 Obesity, unspecified: Secondary | ICD-10-CM

## 2023-03-13 DIAGNOSIS — O09522 Supervision of elderly multigravida, second trimester: Secondary | ICD-10-CM

## 2023-03-13 DIAGNOSIS — R7309 Other abnormal glucose: Secondary | ICD-10-CM

## 2023-03-13 NOTE — Telephone Encounter (Signed)
Called pt x 2; VM left stating I am calling with results and will also send MyChart message.

## 2023-03-26 ENCOUNTER — Other Ambulatory Visit: Payer: Self-pay

## 2023-03-26 ENCOUNTER — Other Ambulatory Visit: Payer: Medicaid Other

## 2023-03-26 ENCOUNTER — Ambulatory Visit (INDEPENDENT_AMBULATORY_CARE_PROVIDER_SITE_OTHER): Payer: Medicaid Other | Admitting: Obstetrics & Gynecology

## 2023-03-26 VITALS — BP 114/76 | HR 85 | Wt 200.0 lb

## 2023-03-26 DIAGNOSIS — Z98891 History of uterine scar from previous surgery: Secondary | ICD-10-CM

## 2023-03-26 DIAGNOSIS — O23592 Infection of other part of genital tract in pregnancy, second trimester: Secondary | ICD-10-CM

## 2023-03-26 DIAGNOSIS — O99810 Abnormal glucose complicating pregnancy: Secondary | ICD-10-CM

## 2023-03-26 DIAGNOSIS — Z8759 Personal history of other complications of pregnancy, childbirth and the puerperium: Secondary | ICD-10-CM

## 2023-03-26 DIAGNOSIS — Z3A20 20 weeks gestation of pregnancy: Secondary | ICD-10-CM

## 2023-03-26 DIAGNOSIS — O09522 Supervision of elderly multigravida, second trimester: Secondary | ICD-10-CM

## 2023-03-26 DIAGNOSIS — O0990 Supervision of high risk pregnancy, unspecified, unspecified trimester: Secondary | ICD-10-CM

## 2023-03-26 DIAGNOSIS — O99012 Anemia complicating pregnancy, second trimester: Secondary | ICD-10-CM

## 2023-03-26 MED ORDER — METRONIDAZOLE 500 MG PO TABS: 500.0000 mg | ORAL_TABLET | Freq: Two times a day (BID) | ORAL | 0 refills | Status: DC

## 2023-03-26 NOTE — Addendum Note (Signed)
Addended by: Jaynie Collins A on: 03/26/2023 02:15 PM   Modules accepted: Orders

## 2023-03-26 NOTE — Progress Notes (Addendum)
   PRENATAL VISIT NOTE  Subjective:  Virginia Guerrero is a 42 y.o. G5P3013 at [redacted]w[redacted]d being seen today for ongoing prenatal care.  She is currently monitored for the following issues for this high-risk pregnancy and has Anxiety and depression; History of cesarean section x 2; Supervision of high risk pregnancy, antepartum; AMA (advanced maternal age) multigravida 57+; History of pre-eclampsia; and Obesity affecting pregnancy on their problem list.  Patient reports no complaints.  Contractions: Not present. Vag. Bleeding: None.  Movement: Present. Denies leaking of fluid.   The following portions of the patient's history were reviewed and updated as appropriate: allergies, current medications, past family history, past medical history, past social history, past surgical history and problem list.   Objective:   Vitals:   03/26/23 1340  BP: 114/76  Pulse: 85  Weight: 200 lb (90.7 kg)    Fetal Status: Fetal Heart Rate (bpm): 152   Movement: Present     General:  Alert, oriented and cooperative. Patient is in no acute distress.  Skin: Skin is warm and dry. No rash noted.   Cardiovascular: Normal heart rate noted  Respiratory: Normal respiratory effort, no problems with respiration noted  Abdomen: Soft, gravid, appropriate for gestational age.  Pain/Pressure: Present     Pelvic: Cervical exam deferred        Extremities: Normal range of motion.     Mental Status: Normal mood and affect. Normal behavior. Normal judgment and thought content.   Assessment and Plan:  Pregnancy: [id number] at [redacted]w[redacted]d 1. Prediabetes in mother during pregnancy Will get GTT next visit.  2. Vaginitis affecting pregnancy in second trimester, antepartum Seen on pap, treated now - metroNIDAZOLE (FLAGYL) 500 MG tablet; Take 1 tablet (500 mg total) by mouth 2 (two) times daily.  Dispense: 14 tablet; Refill: 0  3. History of pre-eclampsia Stable BP, continue ASA.  4. History of cesarean section x 2 Desires TOLAC, will  sign consent later.  5. Multigravida of advanced maternal age in second trimester LR NIPS  6. [redacted] weeks gestation of pregnancy 7. Supervision of high risk pregnancy, antepartum Not drawn/checked last visit, will follow up results and manage accordingly. - CBC/D/Plt+RPR+Rh+ABO+RubIgG... - Protein/Creatine, urine Preterm labor symptoms and general obstetric precautions including but not limited to vaginal bleeding, contractions, leaking of fluid and fetal movement were reviewed in detail with the patient. Please refer to After Visit Summary for other counseling recommendations.   Return in about 29 days (around 04/24/2023) for 2 hr GTT, 3rd trimester labs, OFFICE OB VISIT (MD only).  Future Appointments  Date Time Provider Department Center  04/14/2023  3:15 PM Federico Flake, MD [facility]  04/24/2023  9:15 AM [facility] NURSE [facility]  04/24/2023  9:30 AM [facility] US3 [facility]    Jaynie Collins, MD

## 2023-03-26 NOTE — Patient Instructions (Signed)
Oral Glucose Tolerance Test During Pregnancy Why am I having this test? The oral glucose tolerance test (GTT) is done to check how your body processes blood sugar (glucose). This is one of several tests used to diagnose diabetes that develops during pregnancy (gestational diabetes mellitus). Gestational diabetes is a short-term form of diabetes that some women develop while they are pregnant. It usually occurs during the second or third trimester of pregnancy and goes away after delivery. Testing, or screening, for gestational diabetes usually occurs around 16 of pregnancy. This test may also be needed earlier if: You have a history of gestational diabetes. There is a history of giving birth to very large babies or of losing pregnancies (having stillbirths). You have signs and symptoms of diabetes, such as: Changes in your eyesight. Tingling or numbness in your hands or feet. Changes in hunger, thirst, and urination, and these are not explained by your pregnancy. What is being tested? This test measures the amount of glucose in your blood at different times during a period of 2 hours. This shows how well your body can process glucose.  You will have three separate blood draws. What kind of sample is taken?  Blood samples are required for this test. They are usually collected by inserting a needle into a blood vessel. How do I prepare for this test? For 3 days before your test, eat normally. Have plenty of carbohydrate-rich foods. You will be asked not to eat or drink anything other than water (to fast) starting 8-10 hours before the test. Tell a health care provider about: All medicines you are taking, including vitamins, herbs, eye drops, creams, and over-the-counter medicines. Any blood disorders you have. Any surgeries you have had. Any medical conditions you have. What happens during the test? First, your blood glucose will be measured. This is referred to as your fasting blood glucose  because you fasted before the test. Then, you will drink a glucose solution that contains a certain amount of glucose. Your blood glucose will be measured again 1 and 2 hours after you drink the solution. This test takes about 2 hours to complete. You will need to stay at the testing location during this time. During the testing period: Do not eat or drink anything other than the glucose solution. Do not exercise. Do not use any products that contain nicotine or tobacco, such as cigarettes, e-cigarettes, and chewing tobacco. These can affect your test results. If you need help quitting, ask your health care provider. The testing procedure may vary among health care providers and hospitals. How are the results reported? Your results will be reported as milligrams of glucose per deciliter of blood (mg/dL) or millimoles per liter (mmol/L). There is more than one source for screening and diagnosis reference values used to diagnose gestational diabetes. Your health care provider will compare your results to normal values that were established after testing a large group of people (reference values). Reference values may vary among labs and hospitals. For this test, reference values are: Fasting: 92 mg/dL 1 hour: 295 mg/dL  2 hour: 621 mg/dL   What do the results mean? Results below the reference values are considered normal. If one or more of your blood glucose levels are at or above the reference values, you will be diagnosed with gestational diabetes.  Talk with your health care provider about what your results mean. Questions to ask your health care provider Ask your health care provider, or the department that is doing the test: When  will my results be ready? How will I get my results? What are my treatment options? What other tests do I need? What are my next steps? Summary The oral glucose tolerance test (GTT) is one of several tests used to diagnose diabetes that develops during pregnancy  (gestational diabetes mellitus). Gestational diabetes is a short-term form of diabetes that some women develop while they are pregnant. You may also have this test if you have any symptoms or risk factors for this type of diabetes. Talk with your health care provider about what your results mean. This information is not intended to replace advice given to you by your health care provider. Make sure you discuss any questions you have with your health care provider.

## 2023-03-27 DIAGNOSIS — O99012 Anemia complicating pregnancy, second trimester: Secondary | ICD-10-CM | POA: Insufficient documentation

## 2023-03-27 LAB — CBC/D/PLT+RPR+RH+ABO+RUBIGG...
Antibody Screen: NEGATIVE
Basophils Absolute: 0 10*3/uL (ref 0.0–0.2)
Basos: 0 %
EOS (ABSOLUTE): 0.1 10*3/uL (ref 0.0–0.4)
Eos: 2 %
HCV Ab: NONREACTIVE
HIV Screen 4th Generation wRfx: NONREACTIVE
Hematocrit: 30.9 % — ABNORMAL LOW (ref 34.0–46.6)
Hemoglobin: 9.8 g/dL — ABNORMAL LOW (ref 11.1–15.9)
Hepatitis B Surface Ag: NEGATIVE
Immature Grans (Abs): 0 10*3/uL (ref 0.0–0.1)
Immature Granulocytes: 1 %
Lymphocytes Absolute: 1.6 10*3/uL (ref 0.7–3.1)
Lymphs: 29 %
MCH: 25.1 pg — ABNORMAL LOW (ref 26.6–33.0)
MCHC: 31.7 g/dL (ref 31.5–35.7)
MCV: 79 fL (ref 79–97)
Monocytes Absolute: 0.5 10*3/uL (ref 0.1–0.9)
Monocytes: 9 %
Neutrophils Absolute: 3.3 10*3/uL (ref 1.4–7.0)
Neutrophils: 59 %
Platelets: 288 10*3/uL (ref 150–450)
RBC: 3.91 x10E6/uL (ref 3.77–5.28)
RDW: 15.2 % (ref 11.7–15.4)
RPR Ser Ql: NONREACTIVE
Rh Factor: POSITIVE
Rubella Antibodies, IGG: 6.32 index (ref >0.99)
WBC: 5.5 10*3/uL (ref 3.4–10.8)

## 2023-03-27 LAB — PROTEIN / CREATININE RATIO, URINE
Creatinine, Urine: 91.9 mg/dL
Protein, Ur: 7.8 mg/dL
Protein/Creat Ratio: 85 mg/g creat (ref 0–200)

## 2023-03-27 LAB — HCV INTERPRETATION

## 2023-03-27 MED ORDER — FERRIC MALTOL 30 MG PO CAPS: 1.0000 | ORAL_CAPSULE | Freq: Two times a day (BID) | ORAL | 2 refills | Status: AC

## 2023-03-27 NOTE — Addendum Note (Signed)
Addended by: Jaynie Collins A on: 03/27/2023 11:49 AM   Modules accepted: Orders

## 2023-04-14 ENCOUNTER — Encounter: Payer: Medicaid Other | Admitting: Family Medicine

## 2023-04-24 ENCOUNTER — Encounter: Payer: Self-pay | Admitting: Obstetrics and Gynecology

## 2023-04-24 ENCOUNTER — Other Ambulatory Visit: Payer: Medicaid Other

## 2023-04-24 ENCOUNTER — Ambulatory Visit: Payer: Medicaid Other | Admitting: *Deleted

## 2023-04-24 ENCOUNTER — Other Ambulatory Visit: Payer: Self-pay | Admitting: *Deleted

## 2023-04-24 ENCOUNTER — Ambulatory Visit (INDEPENDENT_AMBULATORY_CARE_PROVIDER_SITE_OTHER): Payer: Medicaid Other | Admitting: Obstetrics and Gynecology

## 2023-04-24 ENCOUNTER — Other Ambulatory Visit: Payer: Self-pay

## 2023-04-24 ENCOUNTER — Ambulatory Visit: Payer: Medicaid Other | Attending: Obstetrics

## 2023-04-24 VITALS — BP 129/83 | HR 80 | Wt 212.1 lb

## 2023-04-24 VITALS — BP 146/81 | HR 79

## 2023-04-24 DIAGNOSIS — O402XX0 Polyhydramnios, second trimester, not applicable or unspecified: Secondary | ICD-10-CM

## 2023-04-24 DIAGNOSIS — O99212 Obesity complicating pregnancy, second trimester: Secondary | ICD-10-CM | POA: Diagnosis present

## 2023-04-24 DIAGNOSIS — O09522 Supervision of elderly multigravida, second trimester: Secondary | ICD-10-CM

## 2023-04-24 DIAGNOSIS — E669 Obesity, unspecified: Secondary | ICD-10-CM

## 2023-04-24 DIAGNOSIS — O99012 Anemia complicating pregnancy, second trimester: Secondary | ICD-10-CM

## 2023-04-24 DIAGNOSIS — O09292 Supervision of pregnancy with other poor reproductive or obstetric history, second trimester: Secondary | ICD-10-CM | POA: Diagnosis present

## 2023-04-24 DIAGNOSIS — O409XX0 Polyhydramnios, unspecified trimester, not applicable or unspecified: Secondary | ICD-10-CM

## 2023-04-24 DIAGNOSIS — Z8759 Personal history of other complications of pregnancy, childbirth and the puerperium: Secondary | ICD-10-CM

## 2023-04-24 DIAGNOSIS — Z3A25 25 weeks gestation of pregnancy: Secondary | ICD-10-CM

## 2023-04-24 DIAGNOSIS — O403XX0 Polyhydramnios, third trimester, not applicable or unspecified: Secondary | ICD-10-CM

## 2023-04-24 DIAGNOSIS — O34219 Maternal care for unspecified type scar from previous cesarean delivery: Secondary | ICD-10-CM | POA: Diagnosis not present

## 2023-04-24 DIAGNOSIS — O0990 Supervision of high risk pregnancy, unspecified, unspecified trimester: Secondary | ICD-10-CM | POA: Diagnosis present

## 2023-04-24 DIAGNOSIS — O99210 Obesity complicating pregnancy, unspecified trimester: Secondary | ICD-10-CM

## 2023-04-24 DIAGNOSIS — O99810 Abnormal glucose complicating pregnancy: Secondary | ICD-10-CM

## 2023-04-24 DIAGNOSIS — Z98891 History of uterine scar from previous surgery: Secondary | ICD-10-CM

## 2023-04-24 NOTE — Progress Notes (Signed)
   PRENATAL VISIT NOTE  Subjective:  Virginia Guerrero is a 42 y.o. [id number] at [redacted]w[redacted]d being seen today for ongoing prenatal care.  She is currently monitored for the following issues for this high-risk pregnancy and has Anxiety and depression; History of cesarean section x 2; Supervision of high risk pregnancy, antepartum; AMA (advanced maternal age) multigravida 36+; History of pre-eclampsia; Obesity affecting pregnancy; and Anemia affecting pregnancy in second trimester on their problem list.  Patient reports no complaints.  Contractions: Irritability. Vag. Bleeding: None.  Movement: Present. Denies leaking of fluid.   The following portions of the patient's history were reviewed and updated as appropriate: allergies, current medications, past family history, past medical history, past social history, past surgical history and problem list.   Objective:   Vitals:   04/24/23 1053  BP: 129/83  Pulse: 80  Weight: 212 lb 1.6 oz (96.2 kg)    Fetal Status: Fetal Heart Rate (bpm): 145   Movement: Present     General:  Alert, oriented and cooperative. Patient is in no acute distress.  Skin: Skin is warm and dry. No rash noted.   Cardiovascular: Normal heart rate noted  Respiratory: Normal respiratory effort, no problems with respiration noted  Abdomen: Soft, gravid, appropriate for gestational age.  Pain/Pressure: Present     Pelvic: Cervical exam deferred        Extremities: Normal range of motion.  Edema: Trace  Mental Status: Normal mood and affect. Normal behavior. Normal judgment and thought content.   Assessment and Plan:  Pregnancy: G5P3013 at [redacted]w[redacted]d 1. Pregnancy with 25 completed weeks gestation  2. Multigravida of advanced maternal age in second trimester LR NIPS female  3. Anemia affecting pregnancy in second trimester PO iron  4. History of cesarean section x 2 Considering TOLAC. Address at nv.   5. History of pre-eclampsia BP today wnl Baseline labs wnl. P/c ratio wnl.   Continue ldasa  6. Obesity affecting pregnancy, antepartum, unspecified obesity type Growth today with MFM. 53%ile, normal AC.   7. Supervision of high risk pregnancy, antepartum 28w labs scheduled.  TDAP offered next visit.  Poly noted - will continue to follow. Has GTT next week.   Preterm labor symptoms and general obstetric precautions including but not limited to vaginal bleeding, contractions, leaking of fluid and fetal movement were reviewed in detail with the patient. Please refer to After Visit Summary for other counseling recommendations.   Return in about 4 weeks (around 05/22/2023) for 2 hr GTT/28w labs, OB VISIT, MD or APP.  Future Appointments  Date Time Provider Department Center  04/29/2023  8:20 AM [facility]  05/22/2023  1:30 PM [facility] US1 [facility]    Milas Hock, MD

## 2023-04-29 ENCOUNTER — Other Ambulatory Visit: Payer: Self-pay

## 2023-04-29 ENCOUNTER — Other Ambulatory Visit: Payer: Medicaid Other

## 2023-04-29 DIAGNOSIS — R7309 Other abnormal glucose: Secondary | ICD-10-CM

## 2023-04-30 LAB — GLUCOSE TOLERANCE, 2 HOURS W/ 1HR
Glucose, 1 hour: 108 mg/dL (ref 70–179)
Glucose, 2 hour: 103 mg/dL (ref 70–152)
Glucose, Fasting: 84 mg/dL (ref 70–91)

## 2023-05-21 NOTE — Progress Notes (Unsigned)
   PRENATAL VISIT NOTE  Subjective:  Virginia Guerrero is a 42 y.o. [id number] at [redacted]w[redacted]d being seen today for ongoing prenatal care.  She is currently monitored for the following issues for this high-risk pregnancy and has History of cesarean section x 2; Supervision of high risk pregnancy, antepartum; AMA (advanced maternal age) multigravida 29+; History of pre-eclampsia; Obesity affecting pregnancy; Anemia affecting pregnancy in second trimester; and Polyhydramnios on their problem list.  Patient reports {sx:14538}.   .  .   . Denies leaking of fluid.   The following portions of the patient's history were reviewed and updated as appropriate: allergies, current medications, past family history, past medical history, past social history, past surgical history and problem list.   Objective:  There were no vitals filed for this visit.  Fetal Status:           General:  Alert, oriented and cooperative. Patient is in no acute distress.  Skin: Skin is warm and dry. No rash noted.   Cardiovascular: Normal heart rate noted  Respiratory: Normal respiratory effort, no problems with respiration noted  Abdomen: Soft, gravid, appropriate for gestational age.        Pelvic: Cervical exam deferred        Extremities: Normal range of motion.     Mental Status: Normal mood and affect. Normal behavior. Normal judgment and thought content.   Assessment and Plan:  Pregnancy: O9G2952 at [redacted]w[redacted]d 1. Supervision of high risk pregnancy, antepartum Needs cbc, hiv, rpr - check today.  Offer tdap - ***  2. Obesity affecting pregnancy, antepartum, unspecified obesity type  3. Polyhydramnios, antepartum, single or unspecified fetus 2hr wnl [place] today ***  4. History of pre-eclampsia BP today *** Baseline labs wnl. P/c ratio wnl.  Continue ldasa  5. History of cesarean section x 2 ***  6. Anemia affecting pregnancy in second trimester CBC today  7. Antepartum multigravida of advanced maternal age  9.  Pregnancy with 29 completed weeks gestation   Preterm labor symptoms and general obstetric precautions including but not limited to vaginal bleeding, contractions, leaking of fluid and fetal movement were reviewed in detail with the patient. Please refer to After Visit Summary for other counseling recommendations.   No follow-ups on file.  Future Appointments  Date Time Provider Department Center  05/22/2023  1:30 PM [facility] US1 [facility]  05/22/2023  2:35 PM Milas Hock, MD [facility]  06/12/2023 10:55 AM Warden Fillers, MD [facility]    Milas Hock, MD

## 2023-05-22 ENCOUNTER — Ambulatory Visit: Payer: Medicaid Other | Attending: Obstetrics and Gynecology

## 2023-05-22 ENCOUNTER — Other Ambulatory Visit: Payer: Self-pay | Admitting: *Deleted

## 2023-05-22 ENCOUNTER — Ambulatory Visit: Payer: Medicaid Other | Admitting: Obstetrics and Gynecology

## 2023-05-22 ENCOUNTER — Other Ambulatory Visit: Payer: Self-pay

## 2023-05-22 VITALS — BP 138/78 | HR 79 | Wt 218.4 lb

## 2023-05-22 DIAGNOSIS — O99810 Abnormal glucose complicating pregnancy: Secondary | ICD-10-CM

## 2023-05-22 DIAGNOSIS — O99013 Anemia complicating pregnancy, third trimester: Secondary | ICD-10-CM

## 2023-05-22 DIAGNOSIS — O34219 Maternal care for unspecified type scar from previous cesarean delivery: Secondary | ICD-10-CM

## 2023-05-22 DIAGNOSIS — Z98891 History of uterine scar from previous surgery: Secondary | ICD-10-CM

## 2023-05-22 DIAGNOSIS — Z3A29 29 weeks gestation of pregnancy: Secondary | ICD-10-CM

## 2023-05-22 DIAGNOSIS — O09523 Supervision of elderly multigravida, third trimester: Secondary | ICD-10-CM

## 2023-05-22 DIAGNOSIS — O09522 Supervision of elderly multigravida, second trimester: Secondary | ICD-10-CM | POA: Diagnosis present

## 2023-05-22 DIAGNOSIS — O99213 Obesity complicating pregnancy, third trimester: Secondary | ICD-10-CM

## 2023-05-22 DIAGNOSIS — O09293 Supervision of pregnancy with other poor reproductive or obstetric history, third trimester: Secondary | ICD-10-CM

## 2023-05-22 DIAGNOSIS — D649 Anemia, unspecified: Secondary | ICD-10-CM

## 2023-05-22 DIAGNOSIS — O409XX0 Polyhydramnios, unspecified trimester, not applicable or unspecified: Secondary | ICD-10-CM | POA: Insufficient documentation

## 2023-05-22 DIAGNOSIS — O3663X0 Maternal care for excessive fetal growth, third trimester, not applicable or unspecified: Secondary | ICD-10-CM

## 2023-05-22 DIAGNOSIS — O0990 Supervision of high risk pregnancy, unspecified, unspecified trimester: Secondary | ICD-10-CM

## 2023-05-22 DIAGNOSIS — O403XX0 Polyhydramnios, third trimester, not applicable or unspecified: Secondary | ICD-10-CM

## 2023-05-22 DIAGNOSIS — O99210 Obesity complicating pregnancy, unspecified trimester: Secondary | ICD-10-CM

## 2023-05-22 DIAGNOSIS — O09529 Supervision of elderly multigravida, unspecified trimester: Secondary | ICD-10-CM

## 2023-05-22 DIAGNOSIS — E669 Obesity, unspecified: Secondary | ICD-10-CM

## 2023-05-22 DIAGNOSIS — Z8759 Personal history of other complications of pregnancy, childbirth and the puerperium: Secondary | ICD-10-CM

## 2023-05-22 DIAGNOSIS — O99012 Anemia complicating pregnancy, second trimester: Secondary | ICD-10-CM

## 2023-05-22 MED ORDER — METRONIDAZOLE 0.75 % VA GEL: 1.0000 | Freq: Every day | VAGINAL | 5 refills | Status: AC

## 2023-05-23 LAB — CBC
Hematocrit: 29.4 % — ABNORMAL LOW (ref 34.0–46.6)
Hemoglobin: 8.9 g/dL — ABNORMAL LOW (ref 11.1–15.9)
MCH: 23.7 pg — ABNORMAL LOW (ref 26.6–33.0)
MCHC: 30.3 g/dL — ABNORMAL LOW (ref 31.5–35.7)
MCV: 78 fL — ABNORMAL LOW (ref 79–97)
Platelets: 301 10*3/uL (ref 150–450)
RBC: 3.76 x10E6/uL — ABNORMAL LOW (ref 3.77–5.28)
RDW: 14.5 % (ref 11.7–15.4)
WBC: 7.6 10*3/uL (ref 3.4–10.8)

## 2023-05-23 LAB — RPR: RPR Ser Ql: NONREACTIVE

## 2023-05-23 LAB — HIV ANTIBODY (ROUTINE TESTING W REFLEX): HIV Screen 4th Generation wRfx: NONREACTIVE

## 2023-05-27 ENCOUNTER — Encounter: Payer: Self-pay | Admitting: *Deleted

## 2023-06-12 ENCOUNTER — Other Ambulatory Visit: Payer: Self-pay

## 2023-06-12 ENCOUNTER — Ambulatory Visit: Payer: Medicaid Other | Admitting: Obstetrics and Gynecology

## 2023-06-12 VITALS — BP 136/85 | HR 79 | Wt 226.0 lb

## 2023-06-12 DIAGNOSIS — O0990 Supervision of high risk pregnancy, unspecified, unspecified trimester: Secondary | ICD-10-CM

## 2023-06-12 DIAGNOSIS — O99210 Obesity complicating pregnancy, unspecified trimester: Secondary | ICD-10-CM

## 2023-06-12 DIAGNOSIS — Z98891 History of uterine scar from previous surgery: Secondary | ICD-10-CM

## 2023-06-12 DIAGNOSIS — Z8759 Personal history of other complications of pregnancy, childbirth and the puerperium: Secondary | ICD-10-CM

## 2023-06-12 DIAGNOSIS — O403XX0 Polyhydramnios, third trimester, not applicable or unspecified: Secondary | ICD-10-CM

## 2023-06-12 DIAGNOSIS — Z3A32 32 weeks gestation of pregnancy: Secondary | ICD-10-CM

## 2023-06-12 DIAGNOSIS — O09523 Supervision of elderly multigravida, third trimester: Secondary | ICD-10-CM

## 2023-06-12 NOTE — Progress Notes (Signed)
   PRENATAL VISIT NOTE  Subjective:  Virginia Guerrero is a 42 y.o. [id number] at [redacted]w[redacted]d being seen today for ongoing prenatal care.  She is currently monitored for the following issues for this high-risk pregnancy and has History of cesarean section x 2; Supervision of high risk pregnancy, antepartum; AMA (advanced maternal age) multigravida 50+; History of pre-eclampsia; Obesity affecting pregnancy; Anemia affecting pregnancy in second trimester; and Polyhydramnios on their problem list.  Patient doing well with no acute concerns today. She reports no complaints.  Contractions: Irritability. Vag. Bleeding: None.  Movement: Present. Denies leaking of fluid.   The following portions of the patient's history were reviewed and updated as appropriate: allergies, current medications, past family history, past medical history, past social history, past surgical history and problem list. Problem list updated.  Objective:   Vitals:   06/12/23 1144  BP: 136/85  Pulse: 79  Weight: 226 lb (102.5 kg)    Fetal Status: Fetal Heart Rate (bpm): 145   Movement: Present     General:  Alert, oriented and cooperative. Patient is in no acute distress.  Skin: Skin is warm and dry. No rash noted.   Cardiovascular: Normal heart rate noted  Respiratory: Normal respiratory effort, no problems with respiration noted  Abdomen: Soft, gravid, appropriate for gestational age.  Pain/Pressure: Absent     Pelvic: Cervical exam deferred        Extremities: Normal range of motion.  Edema: None  Mental Status:  Normal mood and affect. Normal behavior. Normal judgment and thought content.   Assessment and Plan:  Pregnancy: G5P3013 at [redacted]w[redacted]d  1. [redacted] weeks gestation of pregnancy (Primary)   2. Multigravida of advanced maternal age in third trimester   3. History of cesarean section x 2 Pt desires TOLAc, consent previously signed  4. History of pre-eclampsia No s/sx of preeclampsia  5. Obesity affecting pregnancy,  antepartum, unspecified obesity type   6. Polyhydramnios in third trimester complication, single or unspecified fetus AFI 26, follow up scan in about 2 weeks  7. Supervision of high risk pregnancy, antepartum Continue routine prenatal care  Preterm labor symptoms and general obstetric precautions including but not limited to vaginal bleeding, contractions, leaking of fluid and fetal movement were reviewed in detail with the patient.  Please refer to After Visit Summary for other counseling recommendations.   Return in about 2 weeks (around 06/26/2023) for [facility], in person.   Mariel Aloe, MD Faculty Attending Center for [facility]

## 2023-06-20 ENCOUNTER — Encounter: Payer: Self-pay | Admitting: *Deleted

## 2023-06-27 ENCOUNTER — Ambulatory Visit: Payer: Medicaid Other

## 2023-06-27 ENCOUNTER — Ambulatory Visit: Payer: Medicaid Other | Attending: Maternal & Fetal Medicine

## 2023-07-03 ENCOUNTER — Ambulatory Visit: Payer: Medicaid Other | Attending: Obstetrics and Gynecology

## 2023-07-04 ENCOUNTER — Encounter: Payer: Self-pay | Admitting: *Deleted

## 2023-07-04 ENCOUNTER — Encounter: Payer: Medicaid Other | Admitting: Obstetrics & Gynecology

## 2023-07-04 DIAGNOSIS — O3660X0 Maternal care for excessive fetal growth, unspecified trimester, not applicable or unspecified: Secondary | ICD-10-CM | POA: Insufficient documentation

## 2023-07-10 ENCOUNTER — Ambulatory Visit: Payer: Medicaid Other | Attending: Obstetrics and Gynecology

## 2023-07-17 ENCOUNTER — Ambulatory Visit: Payer: Medicaid Other | Attending: Obstetrics and Gynecology

## 2023-08-02 ENCOUNTER — Other Ambulatory Visit: Payer: Self-pay

## 2023-08-02 ENCOUNTER — Inpatient Hospital Stay (HOSPITAL_COMMUNITY)
Admission: AD | Admit: 2023-08-02 | Discharge: 2023-08-04 | DRG: 787 | Disposition: A | Discharge status: Home / Self Care | Payer: Medicaid Other | Attending: Obstetrics and Gynecology | Admitting: Obstetrics and Gynecology

## 2023-08-02 ENCOUNTER — Inpatient Hospital Stay (HOSPITAL_COMMUNITY): Payer: Medicaid Other | Admitting: Anesthesiology

## 2023-08-02 ENCOUNTER — Encounter (HOSPITAL_COMMUNITY): Payer: Self-pay | Admitting: Obstetrics & Gynecology

## 2023-08-02 ENCOUNTER — Encounter (HOSPITAL_COMMUNITY)
Admission: AD | Disposition: A | Discharge status: Home / Self Care | Payer: Self-pay | Source: Home / Self Care | Attending: Obstetrics & Gynecology

## 2023-08-02 DIAGNOSIS — O1414 Severe pre-eclampsia complicating childbirth: Secondary | ICD-10-CM | POA: Diagnosis present

## 2023-08-02 DIAGNOSIS — D62 Acute posthemorrhagic anemia: Secondary | ICD-10-CM | POA: Diagnosis not present

## 2023-08-02 DIAGNOSIS — O34211 Maternal care for low transverse scar from previous cesarean delivery: Principal | ICD-10-CM | POA: Diagnosis present

## 2023-08-02 DIAGNOSIS — O403XX0 Polyhydramnios, third trimester, not applicable or unspecified: Secondary | ICD-10-CM | POA: Diagnosis present

## 2023-08-02 DIAGNOSIS — N99518 Other cystostomy complication: Secondary | ICD-10-CM

## 2023-08-02 DIAGNOSIS — O26893 Other specified pregnancy related conditions, third trimester: Secondary | ICD-10-CM | POA: Diagnosis present

## 2023-08-02 DIAGNOSIS — O99214 Obesity complicating childbirth: Secondary | ICD-10-CM | POA: Diagnosis present

## 2023-08-02 DIAGNOSIS — O3663X0 Maternal care for excessive fetal growth, third trimester, not applicable or unspecified: Secondary | ICD-10-CM | POA: Diagnosis present

## 2023-08-02 DIAGNOSIS — O9081 Anemia of the puerperium: Secondary | ICD-10-CM | POA: Diagnosis not present

## 2023-08-02 DIAGNOSIS — Z8249 Family history of ischemic heart disease and other diseases of the circulatory system: Secondary | ICD-10-CM | POA: Diagnosis not present

## 2023-08-02 DIAGNOSIS — O1410 Severe pre-eclampsia, unspecified trimester: Secondary | ICD-10-CM | POA: Diagnosis present

## 2023-08-02 DIAGNOSIS — Z3A40 40 weeks gestation of pregnancy: Secondary | ICD-10-CM | POA: Diagnosis not present

## 2023-08-02 DIAGNOSIS — Z91040 Latex allergy status: Secondary | ICD-10-CM

## 2023-08-02 DIAGNOSIS — O09523 Supervision of elderly multigravida, third trimester: Secondary | ICD-10-CM | POA: Diagnosis not present

## 2023-08-02 DIAGNOSIS — Z98891 History of uterine scar from previous surgery: Principal | ICD-10-CM

## 2023-08-02 DIAGNOSIS — O99820 Streptococcus B carrier state complicating pregnancy: Secondary | ICD-10-CM | POA: Diagnosis not present

## 2023-08-02 DIAGNOSIS — Z3A39 39 weeks gestation of pregnancy: Secondary | ICD-10-CM

## 2023-08-02 LAB — CBC
HCT: 30 % — ABNORMAL LOW (ref 36.0–46.0)
Hemoglobin: 9 g/dL — ABNORMAL LOW (ref 12.0–15.0)
MCH: 20.8 pg — ABNORMAL LOW (ref 26.0–34.0)
MCHC: 30 g/dL (ref 30.0–36.0)
MCV: 69.4 fL — ABNORMAL LOW (ref 80.0–100.0)
Platelets: 294 10*3/uL (ref 150–400)
RBC: 4.32 MIL/uL (ref 3.87–5.11)
RDW: 19.1 % — ABNORMAL HIGH (ref 11.5–15.5)
WBC: 8.7 10*3/uL (ref 4.0–10.5)
nRBC: 0.5 % — ABNORMAL HIGH (ref 0.0–0.2)

## 2023-08-02 LAB — TYPE AND SCREEN
ABO/RH(D): AB POS
Antibody Screen: NEGATIVE

## 2023-08-02 LAB — COMPREHENSIVE METABOLIC PANEL
ALT: 16 U/L (ref 0–44)
AST: 22 U/L (ref 15–41)
Albumin: 2.7 g/dL — ABNORMAL LOW (ref 3.5–5.0)
Alkaline Phosphatase: 125 U/L (ref 38–126)
Anion gap: 11 (ref 5–15)
BUN: 9 mg/dL (ref 6–20)
CO2: 18 mmol/L — ABNORMAL LOW (ref 22–32)
Calcium: 8.6 mg/dL — ABNORMAL LOW (ref 8.9–10.3)
Chloride: 107 mmol/L (ref 98–111)
Creatinine, Ser: 0.62 mg/dL (ref 0.44–1.00)
GFR, Estimated: >60 mL/min (ref >60)
Glucose, Bld: 82 mg/dL (ref 70–99)
Potassium: 3.3 mmol/L — ABNORMAL LOW (ref 3.5–5.1)
Sodium: 136 mmol/L (ref 135–145)
Total Bilirubin: 0.4 mg/dL (ref 0.0–1.2)
Total Protein: 6.5 g/dL (ref 6.5–8.1)

## 2023-08-02 LAB — GROUP B STREP BY PCR: Group B strep by PCR: NEGATIVE

## 2023-08-02 SURGERY — Surgical Case
Anesthesia: Spinal

## 2023-08-02 MED ORDER — MORPHINE SULFATE (PF) 0.5 MG/ML IJ SOLN
INTRAMUSCULAR | Status: DC | PRN
  Administered 2023-08-02: 150 ug via INTRATHECAL

## 2023-08-02 MED ORDER — COCONUT OIL OIL: 1.0000 | TOPICAL_OIL | Status: DC | PRN

## 2023-08-02 MED ORDER — SODIUM CHLORIDE 0.9 % IR SOLN
Status: DC | PRN
  Administered 2023-08-02: 1

## 2023-08-02 MED ORDER — BUPIVACAINE IN DEXTROSE 0.75-8.25 % IT SOLN
INTRATHECAL | Status: DC | PRN
  Administered 2023-08-02: 1.8 mL via INTRATHECAL

## 2023-08-02 MED ORDER — NIFEDIPINE 10 MG PO CAPS
10.0000 mg | ORAL_CAPSULE | ORAL | Status: DC | PRN
  Administered 2023-08-02: 10 mg via ORAL
  Filled 2023-08-02: qty 1

## 2023-08-02 MED ORDER — ONDANSETRON HCL 4 MG/2ML IJ SOLN
4.0000 mg | Freq: Four times a day (QID) | INTRAMUSCULAR | Status: DC | PRN
  Administered 2023-08-02: 4 mg via INTRAVENOUS

## 2023-08-02 MED ORDER — SENNOSIDES-DOCUSATE SODIUM 8.6-50 MG PO TABS
2.0000 | ORAL_TABLET | Freq: Every day | ORAL | Status: DC
  Administered 2023-08-03 – 2023-08-04 (×2): 2 via ORAL
  Filled 2023-08-02 (×2): qty 2

## 2023-08-02 MED ORDER — EPHEDRINE 5 MG/ML INJ: 10.0000 mg | INTRAVENOUS | Status: DC | PRN

## 2023-08-02 MED ORDER — MAGNESIUM SULFATE BOLUS VIA INFUSION
4.0000 g | Freq: Once | INTRAVENOUS | Status: AC
  Administered 2023-08-02: 4 g via INTRAVENOUS
  Filled 2023-08-02: qty 1000

## 2023-08-02 MED ORDER — ONDANSETRON HCL 4 MG/2ML IJ SOLN
INTRAMUSCULAR | Status: AC
  Filled 2023-08-02: qty 2

## 2023-08-02 MED ORDER — POTASSIUM CHLORIDE 20 MEQ PO PACK
40.0000 meq | PACK | Freq: Two times a day (BID) | ORAL | Status: DC
  Administered 2023-08-03: 40 meq via ORAL
  Filled 2023-08-02 (×4): qty 2

## 2023-08-02 MED ORDER — OXYCODONE-ACETAMINOPHEN 5-325 MG PO TABS: 1.0000 | ORAL_TABLET | ORAL | Status: DC | PRN

## 2023-08-02 MED ORDER — PHENYLEPHRINE 80 MCG/ML (10ML) SYRINGE FOR IV PUSH (FOR BLOOD PRESSURE SUPPORT): 80.0000 ug | PREFILLED_SYRINGE | INTRAVENOUS | Status: DC | PRN

## 2023-08-02 MED ORDER — OXYTOCIN BOLUS FROM INFUSION: 333.0000 mL | Freq: Once | INTRAVENOUS | Status: DC

## 2023-08-02 MED ORDER — MAGNESIUM SULFATE 40 GM/1000ML IV SOLN
2.0000 g/h | INTRAVENOUS | Status: AC
  Administered 2023-08-03 (×2): 2 g/h via INTRAVENOUS
  Filled 2023-08-02: qty 1000

## 2023-08-02 MED ORDER — ENOXAPARIN SODIUM 40 MG/0.4ML IJ SOSY
40.0000 mg | PREFILLED_SYRINGE | INTRAMUSCULAR | Status: DC
  Filled 2023-08-02: qty 0.4

## 2023-08-02 MED ORDER — NIFEDIPINE 10 MG PO CAPS: 20.0000 mg | ORAL_CAPSULE | ORAL | Status: DC | PRN

## 2023-08-02 MED ORDER — FENTANYL CITRATE (PF) 100 MCG/2ML IJ SOLN: 50.0000 ug | INTRAMUSCULAR | Status: DC | PRN

## 2023-08-02 MED ORDER — MEASLES, MUMPS & RUBELLA VAC IJ SOLR: 0.5000 mL | Freq: Once | INTRAMUSCULAR | Status: DC

## 2023-08-02 MED ORDER — NIFEDIPINE 10 MG PO CAPS
20.0000 mg | ORAL_CAPSULE | ORAL | Status: DC | PRN
  Administered 2023-08-02: 20 mg via ORAL
  Filled 2023-08-02: qty 2

## 2023-08-02 MED ORDER — GABAPENTIN 100 MG PO CAPS
200.0000 mg | ORAL_CAPSULE | Freq: Every day | ORAL | Status: DC
  Administered 2023-08-03 (×2): 200 mg via ORAL
  Filled 2023-08-02 (×3): qty 2

## 2023-08-02 MED ORDER — LACTATED RINGERS IV SOLN
500.0000 mL | INTRAVENOUS | Status: DC | PRN
  Administered 2023-08-02: 1000 mL via INTRAVENOUS

## 2023-08-02 MED ORDER — PHENYLEPHRINE 80 MCG/ML (10ML) SYRINGE FOR IV PUSH (FOR BLOOD PRESSURE SUPPORT)
PREFILLED_SYRINGE | INTRAVENOUS | Status: AC
Start: 2023-08-02 — End: ?
  Filled 2023-08-02: qty 10

## 2023-08-02 MED ORDER — NIFEDIPINE 10 MG PO CAPS: 10.0000 mg | ORAL_CAPSULE | ORAL | Status: DC | PRN

## 2023-08-02 MED ORDER — LACTATED RINGERS IV SOLN: INTRAVENOUS | Status: DC

## 2023-08-02 MED ORDER — MORPHINE SULFATE (PF) 0.5 MG/ML IJ SOLN
INTRAMUSCULAR | Status: AC
  Filled 2023-08-02: qty 10

## 2023-08-02 MED ORDER — FENTANYL-BUPIVACAINE-NACL 0.5-0.125-0.9 MG/250ML-% EP SOLN: 12.0000 mL/h | EPIDURAL | Status: DC | PRN

## 2023-08-02 MED ORDER — OXYTOCIN-SODIUM CHLORIDE 30-0.9 UT/500ML-% IV SOLN: 2.5000 [IU]/h | INTRAVENOUS | Status: DC

## 2023-08-02 MED ORDER — ACETAMINOPHEN 10 MG/ML IV SOLN
INTRAVENOUS | Status: AC
Start: 2023-08-02 — End: ?
  Filled 2023-08-02: qty 100

## 2023-08-02 MED ORDER — OXYCODONE-ACETAMINOPHEN 5-325 MG PO TABS: 2.0000 | ORAL_TABLET | ORAL | Status: DC | PRN

## 2023-08-02 MED ORDER — LABETALOL HCL 5 MG/ML IV SOLN: 80.0000 mg | INTRAVENOUS | Status: DC | PRN

## 2023-08-02 MED ORDER — WITCH HAZEL-GLYCERIN EX PADS: 1.0000 | MEDICATED_PAD | CUTANEOUS | Status: DC | PRN

## 2023-08-02 MED ORDER — PHENYLEPHRINE HCL (PRESSORS) 10 MG/ML IV SOLN
INTRAVENOUS | Status: DC | PRN
  Administered 2023-08-02: 240 ug via INTRAVENOUS

## 2023-08-02 MED ORDER — OXYTOCIN-SODIUM CHLORIDE 30-0.9 UT/500ML-% IV SOLN
2.5000 [IU]/h | INTRAVENOUS | Status: AC
  Administered 2023-08-03: 2.5 [IU]/h via INTRAVENOUS

## 2023-08-02 MED ORDER — MAGNESIUM SULFATE 40 GM/1000ML IV SOLN
2.0000 g/h | INTRAVENOUS | Status: DC
  Filled 2023-08-02: qty 1000

## 2023-08-02 MED ORDER — ACETAMINOPHEN 10 MG/ML IV SOLN
INTRAVENOUS | Status: DC | PRN
  Administered 2023-08-02: 1000 mg via INTRAVENOUS

## 2023-08-02 MED ORDER — SIMETHICONE 80 MG PO CHEW
80.0000 mg | CHEWABLE_TABLET | Freq: Three times a day (TID) | ORAL | Status: DC
  Administered 2023-08-03 – 2023-08-04 (×5): 80 mg via ORAL
  Filled 2023-08-02 (×5): qty 1

## 2023-08-02 MED ORDER — LACTATED RINGERS IV SOLN: 500.0000 mL | INTRAVENOUS | Status: DC | PRN

## 2023-08-02 MED ORDER — CEFAZOLIN SODIUM-DEXTROSE 2-3 GM-%(50ML) IV SOLR
INTRAVENOUS | Status: DC | PRN
  Administered 2023-08-02: 2 g via INTRAVENOUS

## 2023-08-02 MED ORDER — LABETALOL HCL 5 MG/ML IV SOLN: 20.0000 mg | INTRAVENOUS | Status: DC | PRN

## 2023-08-02 MED ORDER — OXYTOCIN-SODIUM CHLORIDE 30-0.9 UT/500ML-% IV SOLN: 1.0000 m[IU]/min | INTRAVENOUS | Status: DC

## 2023-08-02 MED ORDER — IBUPROFEN 600 MG PO TABS
600.0000 mg | ORAL_TABLET | Freq: Four times a day (QID) | ORAL | Status: DC
Start: 2023-08-04 — End: 2023-08-04
  Administered 2023-08-04 (×2): 600 mg via ORAL
  Filled 2023-08-02 (×2): qty 1

## 2023-08-02 MED ORDER — MENTHOL 3 MG MT LOZG: 1.0000 | LOZENGE | OROMUCOSAL | Status: DC | PRN

## 2023-08-02 MED ORDER — LABETALOL HCL 5 MG/ML IV SOLN: 40.0000 mg | INTRAVENOUS | Status: DC | PRN

## 2023-08-02 MED ORDER — DIBUCAINE (PERIANAL) 1 % EX OINT: 1.0000 | TOPICAL_OINTMENT | CUTANEOUS | Status: DC | PRN

## 2023-08-02 MED ORDER — FUROSEMIDE 20 MG PO TABS
20.0000 mg | ORAL_TABLET | Freq: Every day | ORAL | Status: DC
  Administered 2023-08-03 – 2023-08-04 (×2): 20 mg via ORAL
  Filled 2023-08-02 (×2): qty 1

## 2023-08-02 MED ORDER — FLEET ENEMA RE ENEM: 1.0000 | ENEMA | RECTAL | Status: DC | PRN

## 2023-08-02 MED ORDER — POTASSIUM CHLORIDE CRYS ER 20 MEQ PO TBCR
40.0000 meq | EXTENDED_RELEASE_TABLET | Freq: Two times a day (BID) | ORAL | Status: DC
  Filled 2023-08-02: qty 2

## 2023-08-02 MED ORDER — PHENYLEPHRINE HCL-NACL 20-0.9 MG/250ML-% IV SOLN
INTRAVENOUS | Status: DC | PRN
  Administered 2023-08-02: 60 ug/min via INTRAVENOUS

## 2023-08-02 MED ORDER — OXYTOCIN-SODIUM CHLORIDE 30-0.9 UT/500ML-% IV SOLN
2.5000 [IU]/h | INTRAVENOUS | Status: DC
  Administered 2023-08-02: 30 [IU] via INTRAVENOUS

## 2023-08-02 MED ORDER — DIPHENHYDRAMINE HCL 25 MG PO CAPS: 25.0000 mg | ORAL_CAPSULE | Freq: Four times a day (QID) | ORAL | Status: DC | PRN

## 2023-08-02 MED ORDER — ACETAMINOPHEN 500 MG PO TABS
1000.0000 mg | ORAL_TABLET | Freq: Four times a day (QID) | ORAL | Status: DC
  Administered 2023-08-04: 1000 mg via ORAL
  Filled 2023-08-02 (×2): qty 2

## 2023-08-02 MED ORDER — SOD CITRATE-CITRIC ACID 500-334 MG/5ML PO SOLN: 30.0000 mL | ORAL | Status: DC | PRN

## 2023-08-02 MED ORDER — LABETALOL HCL 5 MG/ML IV SOLN
40.0000 mg | INTRAVENOUS | Status: DC | PRN
  Administered 2023-08-02: 40 mg via INTRAVENOUS
  Filled 2023-08-02: qty 8

## 2023-08-02 MED ORDER — LIDOCAINE HCL (PF) 1 % IJ SOLN: 30.0000 mL | INTRAMUSCULAR | Status: DC | PRN

## 2023-08-02 MED ORDER — FENTANYL CITRATE (PF) 250 MCG/5ML IJ SOLN
INTRAMUSCULAR | Status: DC | PRN
  Administered 2023-08-02: 15 ug via INTRATHECAL

## 2023-08-02 MED ORDER — TETANUS-DIPHTH-ACELL PERTUSSIS 5-2.5-18.5 LF-MCG/0.5 IM SUSY: 0.5000 mL | PREFILLED_SYRINGE | Freq: Once | INTRAMUSCULAR | Status: DC

## 2023-08-02 MED ORDER — DIPHENHYDRAMINE HCL 50 MG/ML IJ SOLN: 12.5000 mg | INTRAMUSCULAR | Status: DC | PRN

## 2023-08-02 MED ORDER — NIFEDIPINE ER OSMOTIC RELEASE 30 MG PO TB24: 30.0000 mg | ORAL_TABLET | Freq: Every day | ORAL | Status: DC

## 2023-08-02 MED ORDER — OXYTOCIN-SODIUM CHLORIDE 30-0.9 UT/500ML-% IV SOLN
INTRAVENOUS | Status: AC
  Filled 2023-08-02: qty 500

## 2023-08-02 MED ORDER — DEXAMETHASONE SODIUM PHOSPHATE 4 MG/ML IJ SOLN
INTRAMUSCULAR | Status: AC
Start: 2023-08-02 — End: ?
  Filled 2023-08-02: qty 2

## 2023-08-02 MED ORDER — TRANEXAMIC ACID-NACL 1000-0.7 MG/100ML-% IV SOLN
INTRAVENOUS | Status: DC | PRN
  Administered 2023-08-02: 1000 mg via INTRAVENOUS

## 2023-08-02 MED ORDER — TERBUTALINE SULFATE 1 MG/ML IJ SOLN: 0.2500 mg | Freq: Once | INTRAMUSCULAR | Status: DC | PRN

## 2023-08-02 MED ORDER — SIMETHICONE 80 MG PO CHEW: 80.0000 mg | CHEWABLE_TABLET | ORAL | Status: DC | PRN

## 2023-08-02 MED ORDER — MAGNESIUM HYDROXIDE 400 MG/5ML PO SUSP: 30.0000 mL | ORAL | Status: DC | PRN

## 2023-08-02 MED ORDER — LACTATED RINGERS IV SOLN: INTRAVENOUS | Status: AC

## 2023-08-02 MED ORDER — METOCLOPRAMIDE HCL 5 MG/ML IJ SOLN
INTRAMUSCULAR | Status: AC
Start: 2023-08-02 — End: ?
  Filled 2023-08-02: qty 2

## 2023-08-02 MED ORDER — PHENYLEPHRINE HCL-NACL 20-0.9 MG/250ML-% IV SOLN
INTRAVENOUS | Status: AC
  Filled 2023-08-02: qty 250

## 2023-08-02 MED ORDER — HYDRALAZINE HCL 20 MG/ML IJ SOLN: 10.0000 mg | INTRAMUSCULAR | Status: DC | PRN

## 2023-08-02 MED ORDER — SODIUM CHLORIDE 0.9 % IV SOLN
INTRAVENOUS | Status: DC | PRN
  Administered 2023-08-02: 500 mg via INTRAVENOUS

## 2023-08-02 MED ORDER — SOD CITRATE-CITRIC ACID 500-334 MG/5ML PO SOLN
30.0000 mL | ORAL | Status: DC | PRN
  Administered 2023-08-02: 30 mL via ORAL
  Filled 2023-08-02: qty 30

## 2023-08-02 MED ORDER — ZOLPIDEM TARTRATE 5 MG PO TABS
5.0000 mg | ORAL_TABLET | Freq: Every evening | ORAL | Status: DC | PRN
Start: 2023-08-02 — End: 2023-08-04

## 2023-08-02 MED ORDER — METOCLOPRAMIDE HCL 5 MG/ML IJ SOLN
INTRAMUSCULAR | Status: DC | PRN
  Administered 2023-08-02: 10 mg via INTRAVENOUS

## 2023-08-02 MED ORDER — STERILE WATER FOR IRRIGATION IR SOLN
Status: DC | PRN
  Administered 2023-08-02: 1000 mL

## 2023-08-02 MED ORDER — ACETAMINOPHEN 325 MG PO TABS: 650.0000 mg | ORAL_TABLET | ORAL | Status: DC | PRN

## 2023-08-02 MED ORDER — SCOPOLAMINE 1 MG/3DAYS TD PT72
MEDICATED_PATCH | TRANSDERMAL | Status: AC
Start: 2023-08-02 — End: ?
  Filled 2023-08-02: qty 1

## 2023-08-02 MED ORDER — PRENATAL MULTIVITAMIN CH
1.0000 | ORAL_TABLET | Freq: Every day | ORAL | Status: DC
  Filled 2023-08-02: qty 1

## 2023-08-02 MED ORDER — MEDROXYPROGESTERONE ACETATE 150 MG/ML IM SUSP: 150.0000 mg | INTRAMUSCULAR | Status: DC | PRN

## 2023-08-02 MED ORDER — LACTATED RINGERS IV SOLN: 500.0000 mL | Freq: Once | INTRAVENOUS | Status: DC

## 2023-08-02 MED ORDER — ONDANSETRON HCL 4 MG/2ML IJ SOLN: 4.0000 mg | Freq: Four times a day (QID) | INTRAMUSCULAR | Status: DC | PRN

## 2023-08-02 MED ORDER — FENTANYL CITRATE (PF) 100 MCG/2ML IJ SOLN
INTRAMUSCULAR | Status: AC
  Filled 2023-08-02: qty 2

## 2023-08-02 MED ORDER — OXYCODONE HCL 5 MG PO TABS: 5.0000 mg | ORAL_TABLET | ORAL | Status: DC | PRN

## 2023-08-02 MED ORDER — DEXAMETHASONE SODIUM PHOSPHATE 10 MG/ML IJ SOLN
INTRAMUSCULAR | Status: DC | PRN
  Administered 2023-08-02: 10 mg via INTRAVENOUS

## 2023-08-02 MED ORDER — KETOROLAC TROMETHAMINE 30 MG/ML IJ SOLN
30.0000 mg | Freq: Four times a day (QID) | INTRAMUSCULAR | Status: AC
  Administered 2023-08-03 (×4): 30 mg via INTRAVENOUS
  Filled 2023-08-02 (×4): qty 1

## 2023-08-02 SURGICAL SUPPLY — 33 items
BARRIER ADHS 3X4 INTERCEED (GAUZE/BANDAGES/DRESSINGS) IMPLANT
BENZOIN TINCTURE PRP APPL 2/3 (GAUZE/BANDAGES/DRESSINGS) IMPLANT
CHLORAPREP W/TINT 26 (MISCELLANEOUS) ×2 IMPLANT
CLAMP UMBILICAL CORD (MISCELLANEOUS) ×1 IMPLANT
CLOTH BEACON ORANGE TIMEOUT ST (SAFETY) ×1 IMPLANT
DRSG OPSITE POSTOP 4X10 (GAUZE/BANDAGES/DRESSINGS) ×1 IMPLANT
ELECT REM PT RETURN 9FT ADLT (ELECTROSURGICAL) ×1 IMPLANT
ELECTRODE REM PT RTRN 9FT ADLT (ELECTROSURGICAL) ×1 IMPLANT
EXTRACTOR VACUUM KIWI (MISCELLANEOUS) IMPLANT
GAUZE PAD ABD 7.5X8 STRL (GAUZE/BANDAGES/DRESSINGS) IMPLANT
GAUZE SPONGE 4X4 12PLY STRL LF (GAUZE/BANDAGES/DRESSINGS) IMPLANT
GLOVE BIO SURGEON STRL SZ 6.5 (GLOVE) ×1 IMPLANT
GLOVE BIOGEL PI IND STRL 7.0 (GLOVE) ×2 IMPLANT
GOWN STRL REUS W/TWL LRG LVL3 (GOWN DISPOSABLE) ×2 IMPLANT
HEMOSTAT ARISTA ABSORB 3G PWDR (HEMOSTASIS) IMPLANT
KIT ABG SYR 3ML LUER SLIP (SYRINGE) IMPLANT
NDL HYPO 25X5/8 SAFETYGLIDE (NEEDLE) IMPLANT
NEEDLE HYPO 22GX1.5 SAFETY (NEEDLE) IMPLANT
NEEDLE HYPO 25X5/8 SAFETYGLIDE (NEEDLE) IMPLANT
NS IRRIG 1000ML POUR BTL (IV SOLUTION) ×1 IMPLANT
PACK C SECTION WH (CUSTOM PROCEDURE TRAY) ×1 IMPLANT
PAD OB MATERNITY 4.3X12.25 (PERSONAL CARE ITEMS) ×1 IMPLANT
RETRACTOR WND ALEXIS 25 LRG (MISCELLANEOUS) IMPLANT
RTRCTR WOUND ALEXIS 25CM LRG (MISCELLANEOUS) IMPLANT
SPONGE LAP 18X18 X RAY DECT (DISPOSABLE) IMPLANT
SUT MON AB 2-0 CT1 36 (SUTURE) IMPLANT
SUT VIC AB 0 CT1 36 (SUTURE) ×6 IMPLANT
SUT VIC AB 2-0 CT1 TAPERPNT 27 (SUTURE) ×1 IMPLANT
SUT VIC AB 4-0 PS2 27 (SUTURE) ×1 IMPLANT
SYR CONTROL 10ML LL (SYRINGE) IMPLANT
TOWEL OR 17X24 6PK STRL BLUE (TOWEL DISPOSABLE) ×1 IMPLANT
TRAY FOLEY W/BAG SLVR 14FR LF (SET/KITS/TRAYS/PACK) IMPLANT
WATER STERILE IRR 1000ML POUR (IV SOLUTION) ×1 IMPLANT

## 2023-08-02 NOTE — Discharge Summary (Signed)
Postpartum Discharge Summary  Date of Service updated***     Patient Name: Virginia Guerrero DOB: 26-May-1981 MRN: [id number]  Date of admission: 08/02/2023 Delivery date:08/02/2023 Delivering provider: Reva Bores Date of discharge: 08/02/2023  Admitting diagnosis: Normal labor [O80, Z37.9] Severe pre-eclampsia [O14.10] Intrauterine pregnancy: [redacted]w[redacted]d     Secondary diagnosis:  Principal Problem:   Normal labor Active Problems:   Severe pre-eclampsia  Additional problems: Incidental Cystotomy Intraoperative   Discharge diagnosis: Term Pregnancy Delivered, Preeclampsia (severe), Anemia, and rLTCS                                               Post partum procedures:{Postpartum procedures:23558} Augmentation: N/A Complications: Incidental Cystotomy Intra-op   Hospital course: Onset of Labor With Unplanned C/S   43 y.o. yo N8G9562 at [redacted]w[redacted]d was admitted in Latent Labor on 08/02/2023. Patient had a labor course significant for initially wanting to VBAC however upon making it to her L&D room had second thought and after speaking with partner let nursing know she would like to proceed with rLTCS. The patient went for cesarean section due to Elective Repeat. Please see operative note for more details.  Of note, patient did have incidental cystotomy intra-op that was repaired and found to have water seal repair after double layer closure and back filling with sterile milk. Delivery details as follows: Membrane Rupture Time/Date: 8:28 PM,08/02/2023  Delivery Method:C-Section, Low Transverse Operative Delivery:N/A Details of operation can be found in separate operative note. Patient had a postpartum course complicated by***.  She is ambulating,tolerating a regular diet, passing flatus, and urinating well.  Patient is discharged home in stable condition 08/02/23.  Newborn Data: Birth date:08/02/2023 Birth time:8:28 PM Gender:Female Living status:Living Apgars:9 ,9  Weight:3629 g  Magnesium Sulfate  received: Yes: Seizure prophylaxis BMZ received: No Rhophylac:N/A MMR:N/A T-DaP:declined Flu: declined RSV Vaccine received: declined  Transfusion:{Transfusion received:30440034}  Immunizations received: Immunization History  Administered Date(s) Administered   DTaP 02/16/2002   Tdap 11/18/2012, 01/08/2017    Physical exam  Vitals:   08/02/23 1721 08/02/23 1728 08/02/23 1852 08/02/23 2147  BP: 132/77 (!) 140/76 133/78 127/84  Pulse: 83 92 95 77  Resp: 20 20 20  (!) 22  Temp:      TempSrc:      SpO2:    97%  Weight:      Height:       General: {Exam; general:21111117} Lochia: {Desc; appropriate/inappropriate:30686::"appropriate"} Uterine Fundus: {Desc; firm/soft:30687} Incision: {Exam; incision:21111123} DVT Evaluation: {Exam; dvt:2111122} Labs: Lab Results  Component Value Date   WBC 8.7 08/02/2023   HGB 9.0 (L) 08/02/2023   HCT 30.0 (L) 08/02/2023   MCV 69.4 (L) 08/02/2023   PLT 294 08/02/2023      Latest Ref Rng & Units 08/02/2023    3:29 PM  CMP  Glucose 70 - 99 mg/dL 82   BUN 6 - 20 mg/dL 9   Creatinine 1.30 - 8.65 mg/dL 7.84   Sodium 696 - 295 mmol/L 136   Potassium 3.5 - 5.1 mmol/L 3.3   Chloride 98 - 111 mmol/L 107   CO2 22 - 32 mmol/L 18   Calcium 8.9 - 10.3 mg/dL 8.6   Total Protein 6.5 - 8.1 g/dL 6.5   Total Bilirubin 0.0 - 1.2 mg/dL 0.4   Alkaline Phos 38 - 126 U/L 125   AST 15 - 41 U/L  22   ALT 0 - 44 U/L 16    Edinburgh Score:     No data to display         No data recorded  After visit meds:  Allergies as of 08/02/2023       Reactions   Latex Itching   condoms     Med Rec must be completed prior to using this [facility]***        Discharge home in stable condition Infant Feeding: {Baby feeding:23562} Infant Disposition:{CHL IP OB HOME WITH ZOXWRU:04540} Discharge instruction: per After Visit Summary and Postpartum booklet. Activity: Advance as tolerated. Pelvic rest for 6 weeks.  Diet: {OB JWJX:91478295} Future  Appointments:No future appointments. Follow up Visit: Message sent to [facility] 2/1  Please schedule this patient for a In person postpartum visit in 4 weeks with the following provider: Any provider. Additional Postpartum F/U:2 hour GTT, Incision check 1 week, BP check 1 week, and Urinary Catheter check / removal 1 week   High risk pregnancy complicated by: HTN and Anemia, AMA, Polyhydramnios Delivery mode:  C-Section, Low Transverse Anticipated Birth Control:  POPs   08/02/2023 Hessie Dibble, MD

## 2023-08-02 NOTE — H&P (Signed)
OBSTETRIC ADMISSION HISTORY AND PHYSICAL  Virginia Guerrero is a 43 y.o. female [phone] with IUP at [redacted]w[redacted]d by LMP presenting for SOL. She reports +FMs, No LOF, no VB, no blurry vision, headaches or peripheral edema, and RUQ pain.  She plans on breast feeding. She declines birth control. She received her prenatal care at  [facility]    Dating: By LMP --->  Estimated Date of Delivery: 08/07/23  Sono:    @[redacted]w[redacted]d , CWD, normal anatomy, cephalic presentation, posterior placenta, 1683g, 96% EFW  Prenatal History/Complications:    NURSING  PROVIDER  [employer]ator, [facility] [employer] Dating by LMP  [facility] Model Traditional Anatomy U/S normal  Initiated care at  [employer]  English              LAB RESULTS   Support Person Mittie Bodo [employer] NIPS: Low risk female AFP:     NT/IT (FT only)     Carrier Screen Horizon: neg x 4  Rhogam  AB/Positive/-- (09/25 1425) A1C/GTT Early: 5.9 nl 2 hour Third trimester:   Flu Vaccine declined    TDaP Vaccine  Declined-05/22/23 Blood Type AB/Positive/-- (09/25 1425)  Covid Vaccine none Antibody Negative (09/25 1425)    Rubella 6.32 (09/25 1425)  Feeding Plan breast RPR Non Reactive (09/25 1425)  Contraception no method HBsAg Negative (09/25 1425)  Circumcision NA HIV Non Reactive (09/25 1425)  Pediatrician  [facility] HCVAb Non Reactive (09/25 1425)  Prenatal Classes       Pap   BTL Consent NA GC/CT Initial:   36wks:    VBAC Consent Undecided  GBS   For PCN allergy, check sensitivities        DME Rx [ ]  BP cuff [ ]  Weight Scale Waterbirth  [ ]  Class [ ]  Consent [ ]  CNM visit  PHQ9 & GAD7 [  ] new OB [  ] 28 weeks  [  ] 36 weeks Induction  [ ]  Orders Entered [ ] Foley Y/N     Past Medical History: Past Medical History:  Diagnosis Date   Abortion, nontherapeutic    in a clinic   Anxiety and depression 06/20/2011   See Jamie--mindfulness reviewed     Carpal tunnel syndrome    Hx of chest pain    Infections of genitourinary  tract in pregnancy, unspecified as to episode of care(646.60)    Painful respiration    costochondral pain   Syncope and collapse     Past Surgical History: Past Surgical History:  Procedure Laterality Date   CESAREAN SECTION N/A 11/17/2012   Procedure: CESAREAN SECTION;  Surgeon: Lenoard Aden, MD;  Location: [facility]S;  Service: Obstetrics;  Laterality: N/A;   CESAREAN SECTION N/A 03/28/2017   Procedure: CESAREAN SECTION;  Surgeon: Adam Phenix, MD;  Location: [facility] BIRTHING SUITES;  Service: Obstetrics;  Laterality: N/A;   INDUCED ABORTION     in a clinic    Obstetrical History: OB History     Gravida  5   Para  3   Term  3   Preterm  0   AB  1   Living  3      SAB  0   IAB  1   Ectopic  0   Multiple  0   Live Births  3           Social History Social History   Socioeconomic History  Marital status: Single    Spouse name: Not on file   Number of children: Not on file   Years of education: Not on file   Highest education level: Not on file  Occupational History   Not on file  Tobacco Use   Smoking status: Never   Smokeless tobacco: Never  Vaping Use   Vaping status: Never Used  Substance and Sexual Activity   Alcohol use: Not Currently    Comment: twice a year at most   Drug use: Not Currently    Types: Marijuana    Comment: before pregnancy in May 2024   Sexual activity: Yes    Birth control/protection: None  Other Topics Concern   Not on file  Social History Narrative   Not on file   Social Drivers of Health   Financial Resource Strain: Not on file  Food Insecurity: Food Insecurity Present (02/26/2023)   Hunger Vital Sign    Worried About Running Out of Food in the Last Year: Sometimes true    Ran Out of Food in the Last Year: Never true  Transportation Needs: No Transportation Needs (02/26/2023)   PRAPARE - Administrator, Civil Service (Medical): No    Lack of Transportation (Non-Medical): No  Physical Activity:  Not on file  Stress: Not on file  Social Connections: Not on file    Family History: Family History  Problem Relation Age of Onset   Hypertension Mother    Lung cancer Father    Cancer Father        nasopharygeal   Cancer Maternal Grandmother        lymphoma   Lymphoma Maternal Grandmother    Hypertension Maternal Grandfather    Breast cancer Paternal Grandmother 35   Pancreatic cancer Paternal Uncle     Allergies: Allergies  Allergen Reactions   Latex Itching    condoms    Medications Prior to Admission  Medication Sig Dispense Refill Last Dose/Taking   aspirin EC 81 MG tablet Take 1 tablet (81 mg total) by mouth daily. Take after 12 weeks for prevention of preeclampsia later in pregnancy 300 tablet 2    Ferric Maltol 30 MG CAPS Take 1 capsule (30 mg total) by mouth 2 (two) times daily. Please take one hour before breakfast and dinner (Patient not taking: Reported on 06/12/2023) 60 capsule 2    metroNIDAZOLE (METROGEL) 0.75 % vaginal gel Place 1 Applicatorful vaginally at bedtime. Apply one applicatorful to vagina at bedtime for 10 days, then twice a week for 6 months. 70 g 5    Prenatal Vit-Fe Fumarate-FA (MULTIVITAMIN-PRENATAL) 27-0.8 MG TABS tablet Take 1 tablet by mouth daily at 12 noon.        Review of Systems   All systems reviewed and negative except as stated in HPI  Blood pressure (!) 183/109, pulse 93, temperature 98 F (36.7 C), temperature source Oral, resp. rate (!) 24, last menstrual period 10/31/2022, SpO2 100%, currently breastfeeding. General appearance: {general exam:16600} Lungs: clear to auscultation bilaterally Heart: regular rate and rhythm Abdomen: soft, non-tender; bowel sounds normal Pelvic: pelvis proven to 2155g baby in 2007. Extremities: Homans sign is negative, no sign of DVT Presentation: {desc; fetal presentation:14558} Fetal monitoringBaseline: 130 bpm, Variability: Fair (1-6 bpm), Accelerations: Non-reactive but appropriate for  gestational age, and Decelerations: Variable: mild Uterine activityFrequency: Every 2-5 minutes Dilation: 4.5 Exam by:: Cleone Slim, CNM   Prenatal labs: ABO, Rh: AB/Positive/-- (09/25 1425) Antibody: Negative (09/25 1425) Rubella: 6.32 (09/25 1425)  RPR: Non Reactive (11/21 1513)  HBsAg: Negative (09/25 1425)  HIV: Non Reactive (11/21 1515)  GBS:      Lab Results  Component Value Date   GBS Negative 03/13/2017   GTT: negative Genetic screening: negative Anatomy US: all structures visualized and appear normal  Immunization History  Administered Date(s) Administered   DTaP 02/16/2002   Tdap 11/18/2012, 01/08/2017    Prenatal Transfer Tool  Maternal Diabetes: No Genetic Screening: Normal Maternal Ultrasounds/Referrals: Other: mild poly Fetal Ultrasounds or other Referrals:  None Maternal Substance Abuse:  No Significant Maternal Medications:  None Significant Maternal Lab Results: None Number of Prenatal Visits:greater than 3 verified prenatal visits. Lost to follow up after 32 weeks Maternal Vaccinations: declined Other Comments:  None   Results for orders placed or performed during the hospital encounter of 08/02/23 (from the past 24 hours)  CBC   Collection Time: 08/02/23  3:29 PM  Result Value Ref Range   WBC 8.7 4.0 - 10.5 K/uL   RBC 4.32 3.87 - 5.11 MIL/uL   Hemoglobin 9.0 (L) 12.0 - 15.0 g/dL   HCT 16.1 (L) 09.6 - 04.5 %   MCV 69.4 (L) 80.0 - 100.0 fL   MCH 20.8 (L) 26.0 - 34.0 pg   MCHC 30.0 30.0 - 36.0 g/dL   RDW 40.9 (H) 81.1 - 91.4 %   Platelets 294 150 - 400 K/uL   nRBC 0.5 (H) 0.0 - 0.2 %    Patient Active Problem List   Diagnosis Date Noted   Normal labor 08/02/2023   Severe pre-eclampsia 08/02/2023   Large for gestational age fetus affecting management of mother, antepartum 07/04/2023   Polyhydramnios 04/24/2023   Anemia affecting pregnancy in second trimester 03/27/2023   Obesity affecting pregnancy 03/11/2023   Supervision of high risk  pregnancy, antepartum 02/19/2023   AMA (advanced maternal age) multigravida 40+ 02/19/2023   History of pre-eclampsia 02/19/2023   History of cesarean section x 2 11/07/2016    Assessment/Plan:  Carline Dura is a 43 y.o. [id number] at [redacted]w[redacted]d here for SOL. History of 2 CS due to non reassuring fetal heart tones. Vaginal delivery in 2007 for 2155g baby, most recent growth scan of baby with EFW 96%ile.  #Labor: expectant management #Pain: epidural #FWB: Cat 2 #GBS status: unknown - will offer GBS prophylaxis with history of positive swab in 2014. GBS lab drawn and pending.  #Feeding: Breastmilk  #Reproductive Life planning: None #Circ:  not applicable  Margie Billet, MD  08/02/2023, 3:48 PM

## 2023-08-02 NOTE — Plan of Care (Signed)
  Problem: Education: Goal: Knowledge of disease or condition will improve Outcome: Progressing Goal: Knowledge of the prescribed therapeutic regimen will improve Outcome: Progressing   Problem: Fluid Volume: Goal: Peripheral tissue perfusion will improve Outcome: Progressing   Problem: Clinical Measurements: Goal: Complications related to disease process, condition or treatment will be avoided or minimized Outcome: Progressing   Problem: Education: Goal: Knowledge of Childbirth will improve Outcome: Progressing Goal: Ability to make informed decisions regarding treatment and plan of care will improve Outcome: Progressing Goal: Ability to state and carry out methods to decrease the pain will improve Outcome: Progressing Goal: Individualized Educational Video(s) Outcome: Progressing   Problem: Coping: Goal: Ability to verbalize concerns and feelings about labor and delivery will improve Outcome: Progressing   Problem: Life Cycle: Goal: Ability to make normal progression through stages of labor will improve Outcome: Progressing Goal: Ability to effectively push during vaginal delivery will improve Outcome: Progressing   Problem: Role Relationship: Goal: Will demonstrate positive interactions with the child Outcome: Progressing   Problem: Safety: Goal: Risk of complications during labor and delivery will decrease Outcome: Progressing   Problem: Pain Management: Goal: Relief or control of pain from uterine contractions will improve Outcome: Progressing   Problem: Education: Goal: Knowledge of General Education information will improve Description: Including pain rating scale, medication(s)/side effects and non-pharmacologic comfort measures Outcome: Progressing   Problem: Health Behavior/Discharge Planning: Goal: Ability to manage health-related needs will improve Outcome: Progressing   Problem: Clinical Measurements: Goal: Ability to maintain clinical measurements  within normal limits will improve Outcome: Progressing Goal: Will remain free from infection Outcome: Progressing Goal: Diagnostic test results will improve Outcome: Progressing Goal: Respiratory complications will improve Outcome: Progressing Goal: Cardiovascular complication will be avoided Outcome: Progressing   Problem: Activity: Goal: Risk for activity intolerance will decrease Outcome: Progressing   Problem: Nutrition: Goal: Adequate nutrition will be maintained Outcome: Progressing   Problem: Coping: Goal: Level of anxiety will decrease Outcome: Progressing   Problem: Elimination: Goal: Will not experience complications related to bowel motility Outcome: Progressing Goal: Will not experience complications related to urinary retention Outcome: Progressing   Problem: Pain Managment: Goal: General experience of comfort will improve and/or be controlled Outcome: Progressing   Problem: Safety: Goal: Ability to remain free from injury will improve Outcome: Progressing   Problem: Skin Integrity: Goal: Risk for impaired skin integrity will decrease Outcome: Progressing

## 2023-08-02 NOTE — MAU Note (Signed)
.  Virginia Guerrero is a 43 y.o. at [redacted]w[redacted]d here in MAU reporting: contractions since yesterday that are now every 2-3 minutes apart. Denies vaginal bleeding, but does report some leaking. Reports +FM. Planning epidural.   PIH Assessment: Headache present: No  Visual disturbances: None RUQ pain/Epigastric: None Atypical edema: None Hx of HBP: Hx of Pre-E in previous pregnancy BP Medications: None prescribed   LMP: N/A Onset of complaint: Yesterday Vitals:   08/02/23 1514  BP: (!) 180/95  Pulse: 88  Resp: (!) 24  SpO2: 100%     FHT: 145  Lab orders placed from triage: UA

## 2023-08-02 NOTE — Op Note (Signed)
Virginia Guerrero PROCEDURE DATE: 08/02/2023  PREOPERATIVE DIAGNOSES: Intrauterine pregnancy at [redacted]w[redacted]d weeks gestation;  elective repeat (hx of 2 prior c/s for NRFS, s/o Severe PreE)  POSTOPERATIVE DIAGNOSES: The same, viable infant delivered  PROCEDURE: RepeatLow Transverse Cesarean Section  SURGEON:  Dr. Shawnie Pons   ASSISTANT:  Hessie Dibble, MD An experienced assistant was required given the standard of surgical care given the complexity of the case.  This assistant was needed for exposure, dissection, suctioning, retraction, instrument exchange, assisting with delivery with administration of fundal pressure, and for overall help during the procedure.  ANESTHESIOLOGY TEAM: Anesthesiologist: Elmer Picker, MD CRNA: Orlie Pollen, CRNA  INDICATIONS: Virginia Guerrero is a 43 y.o. O9G2952 at [redacted]w[redacted]d here for cesarean section secondary to the indications listed under preoperative diagnoses; please see preoperative note for further details.  The risks of surgery were discussed with the patient including but were not limited to: bleeding which may require transfusion or reoperation; infection which may require antibiotics; injury to bowel, bladder, ureters or other surrounding organs; injury to the fetus; need for additional procedures including hysterectomy in the event of a life-threatening hemorrhage; formation of adhesions; placental abnormalities wth subsequent pregnancies; incisional problems; thromboembolic phenomenon and other postoperative/anesthesia complications.  The patient concurred with the proposed plan, giving informed written consent for the procedure.    FINDINGS:  Viable female infant in cephalic presentation.  Apgars 9 and 9.  Amniotic fluid: meconium.  Intact placenta, three vessel cord.  Normal uterus, fallopian tubes and ovaries bilaterally.  ANESTHESIA: spinal INTRAVENOUS FLUIDS: 1200 ml   ESTIMATED BLOOD LOSS: 704 ml URINE OUTPUT:  200 ml, initially clear then  hematuric SPECIMENS: Placenta sent to L&D . COMPLICATIONS: None immediate  PROCEDURE IN DETAIL:  The patient preoperatively received intravenous antibiotics and had sequential compression devices applied to her lower extremities.  She was then taken to the operating room where spinal anesthesia was found to be adequate. She was then placed in a dorsal supine position with a leftward tilt, and prepped and draped in a sterile manner.  A foley catheter was  placed into her bladder and attached to constant gravity.  After an adequate timeout was performed, a Pfannenstiel skin incision was made with scalpel and carried through to the underlying layer of fascia. The fascia was incised in the midline, and this incision was extended bluntly. The rectus muscles were separated in the midline and the peritoneum was entered bluntly.   The Alexis self-retaining retractor was introduced into the abdominal cavity.  Attention was turned to the lower uterine segment where a low transverse hysterotomy was made with a scalpel and extended bluntly in caudad and cephalad directions.  The infant was successfully delivered, the cord was clamped and cut after one minute, and the infant was handed over to the awaiting neonatology team. Uterine massage was then administered, and the placenta delivered intact with a three-vessel cord. The uterus was then cleared of clots and debris.  The hysterotomy was closed with 0-Vicryl in a running fashion.  It was then noticed that incidental cystotomy transversing the dome of bladder spanning about 4-5 inches. Cystotomy closed with 0-Vicryl followed by a second imbricating layer 0- Monocryl in running fashion. A single figure-of-eight 0 Vicryl serosal stitches were placed to help with hemostasis.  The bladder was back filled with 300cc sterile milk without leak.  Bladder water tight.    The pelvis was cleared of all clot and debris. Hemostasis was confirmed on all surfaces. The uterus  incision  was once again inspected and found to be hemostatic. Arista was used to aid in hemostasis. The retractor was removed.  The peritoneum was re-approximated at the midline avoiding bladder inferiorly with a single 0- Vicryl figure of eight stitch.  The fascia was then closed using 0 Vicryl in a running fashion.  The subcutaneous layer was irrigated, any areas of bleeding were cauterized with the bovie,  was reapproximated with 2-0 plain gut as two simple interrupted stiches, was found to be hemostatic.. . The skin was closed with a 4-0 Vicryl subcuticular stitch. The patient tolerated the procedure well. Sponge, instrument and needle counts were correct x 3.  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 Both were understanding and had no questions / concerns, both were appreciative of insight and talking through the case with them. She was then taken to the recovery room in stable condition.   Hessie Dibble, MD FMOB Fellow, Faculty practice [facility] 08/02/23  9:32 PM

## 2023-08-02 NOTE — Anesthesia Procedure Notes (Signed)
Spinal  Patient location during procedure: OR Start time: 08/02/2023 8:02 PM End time: 08/02/2023 8:04 PM Reason for block: surgical anesthesia Staffing Performed: anesthesiologist  Anesthesiologist: Elmer Picker, MD Performed by: Elmer Picker, MD Authorized by: Elmer Picker, MD   Preanesthetic Checklist Completed: patient identified, IV checked, risks and benefits discussed, surgical consent, monitors and equipment checked, pre-op evaluation and timeout performed Spinal Block Patient position: sitting Prep: DuraPrep and site prepped and draped Patient monitoring: cardiac monitor, continuous pulse ox and blood pressure Approach: midline Location: L3-4 Injection technique: single-shot Needle Needle type: Pencan  Needle gauge: 24 G Needle length: 9 cm Assessment Sensory level: T6 Events: CSF return Additional Notes Functioning IV was confirmed and monitors were applied. Sterile prep and drape, including hand hygiene and sterile gloves were used. The patient was positioned and the spine was prepped. The skin was anesthetized with lidocaine.  Free flow of clear CSF was obtained prior to injecting local anesthetic into the CSF.  The spinal needle aspirated freely following injection.  The needle was carefully withdrawn.  The patient tolerated the procedure well.

## 2023-08-02 NOTE — Anesthesia Preprocedure Evaluation (Addendum)
Anesthesia Evaluation  Patient identified by MRN, date of birth, ID band Patient awake    Reviewed: Allergy & Precautions, NPO status , Patient's Chart, lab work & pertinent test results  Airway Mallampati: II  TM Distance: >3 FB Neck ROM: Full    Dental no notable dental hx.    Pulmonary neg pulmonary ROS   Pulmonary exam normal breath sounds clear to auscultation       Cardiovascular hypertension (preE on mag), Normal cardiovascular exam Rhythm:Regular Rate:Normal     Neuro/Psych  PSYCHIATRIC DISORDERS Anxiety Depression    negative neurological ROS     GI/Hepatic negative GI ROS, Neg liver ROS,,,  Endo/Other  negative endocrine ROS    Renal/GU negative Renal ROS  negative genitourinary   Musculoskeletal negative musculoskeletal ROS (+)    Abdominal   Peds  Hematology  (+) Blood dyscrasia, anemia Lab Results      Component                Value               Date                      WBC                      8.7                 08/02/2023                HGB                      9.0 (L)             08/02/2023                HCT                      30.0 (L)            08/02/2023                MCV                      69.4 (L)            08/02/2023                PLT                      294                 08/02/2023              Anesthesia Other Findings Repeat C/S, h/o 2 prior C/S, pregnancy c/b severe preE on mag  Reproductive/Obstetrics (+) Pregnancy                             Anesthesia Physical Anesthesia Plan  ASA: 3  Anesthesia Plan: Spinal   Post-op Pain Management:    Induction:   PONV Risk Score and Plan: Treatment may vary due to age or medical condition  Airway Management Planned: Natural Airway  Additional Equipment:   Intra-op Plan:   Post-operative Plan:   Informed Consent: I have reviewed the patients History and Physical, chart, labs and discussed the  procedure including the risks, benefits and alternatives for the proposed anesthesia  with the patient or authorized representative who has indicated his/her understanding and acceptance.     Dental advisory given  Plan Discussed with: CRNA  Anesthesia Plan Comments:        Anesthesia Quick Evaluation

## 2023-08-02 NOTE — Progress Notes (Signed)
Labor Progress Note  Virginia Guerrero is a 43 y.o. G4W1027 at [redacted]w[redacted]d presented for IOL TOLAC in s/o Severe PreE  S: per nursing patient requesting C/S.   O:  BP (!) 140/76   Pulse 92   Temp 98 F (36.7 C) (Oral)   Resp 20   Ht 5\' 4"  (1.626 m)   Wt 99.8 kg   LMP 10/31/2022 (Exact Date)   SpO2 100%   BMI 37.76 kg/m  EFM:135bpm/Mild variability/ 10x10 accels/ None decels CAT: 2 Toco: regular, every 8 minutes, spaced since magnesium starting.    CVE: Dilation: 5 Effacement (%): 60 Station: -1 Presentation: Vertex Exam by:: Korynn Kenedy   A&P: 43 y.o. [id number] [redacted]w[redacted]d  here for IOL TOLAC in s/o severe PreE as above  #Labor: Pt states she no longer wishes to VBAC.  Was instructed it is still an option however patient is correct that given concerns for Severe PreE, hx of  failed TOLACs 2/2 NRFS at completion of dilatation (told "both times heads too big to pass through") (original CS b/c of NRFS at completion in G2), only proven to 2155g in G1 (G1 39wk0d SVD, IUGR) with this baby already measuring EFW 96%/AC 96%, HC 62% and BPD >99% at 29wk [place] she can certainly continue with C/S if she desires.  She was counseled neither for or against either option yet given information for both options.  She then spoke with her partner and told nursing and writer that she would no longer like to [facility].  We preformed another CVE given not ruptured and painful ctx upon admission though now spaced on magnesium.  She was 5/60/-1 with a contraction.  She still wishes to continue with a rLTCS.   Virginia Guerrero has agreed to proceed with cesarean section due to Elective repeat and newly dx'ed Severe PreE . The risks of surgery were discussed with the patient including but were not limited to: bleeding which may require transfusion or reoperation; infection which may require antibiotics; injury to bowel, bladder, ureters or other surrounding organs; injury to the fetus; need for additional procedures including hysterectomy  in the event of a life-threatening hemorrhage; formation of adhesions; placental abnormalities wth subsequent pregnancies; incisional problems; thromboembolic phenomenon and other postoperative/anesthesia complications. The patient concurred with the proposed plan, giving informed written consent for the procedure. Patient has been NPO for 12 hours she will remain NPO for procedure. Anesthesia and OR aware. Preoperative prophylactic antibiotics and SCDs ordered on call to the OR. To OR when ready.    #Pain: operative spinal #FWB: CAT 2 #GBS  unknown , PCR presumptive negative, had GBS positive swab in G1 and negative in both G2 and G3, no hx of bacteruria, no hx of newborn affected by GBS infection, not preterm and not ruptured and afebrile so no abx ppx required   #Severe PreE: UPC pending // 294 // 22/16, Cr 0.62 (baseline ~5.5), severe range pressures in MAU, s/p Nifedipine protocol and then Labetalol injection, pt had originally declined Magnesium but later agreed given Severe PreE as well as Hypokalemia.  - continue magnesium, 24 hrs PP - CTM BP   #LGA #Polyhydramnios #Obesity 37.76 (pelvic > abdomen)  #Limited PNC (lost to f/u after 32weeks) #AMA: @[redacted]w[redacted]d , CWD, normal anatomy, cephalic presentation, posterior placenta, 1683g, 96% EFW, normal anatomy scan, LR F on NIPS, Horizon neg x4, A1c 5.9 with normal 2 hr GTT, proven only to 2155g  - CBGs Qshift   #Anemia: admission Hgb 9.0  #Hx of 2 prior CS, one  failed TOLAC: both C/S for NRFS  Hessie Dibble, MD FMOB Fellow, Faculty practice [facility], Center for [facility] 08/02/23  6:52 PM

## 2023-08-02 NOTE — Transfer of Care (Signed)
Immediate Anesthesia Transfer of Care Note  Patient: Virginia Guerrero  Procedure(s) Performed: CESAREAN SECTION  Patient Location: PACU  Anesthesia Type:Spinal  Level of Consciousness: awake, alert , oriented, and patient cooperative  Airway & Oxygen Therapy: Patient Spontanous Breathing  Post-op Assessment: Report given to RN and Post -op Vital signs reviewed and stable  Post vital signs: Reviewed and stable  Last Vitals:  Vitals Value Taken Time  BP 127/84 08/02/23 2147  Temp    Pulse 83 08/02/23 2154  Resp 25 08/02/23 2154  SpO2 97 % 08/02/23 2154  Vitals shown include unfiled device data.  Last Pain:  Vitals:   08/02/23 2147  TempSrc:   PainSc: 0-No pain         Complications: No notable events documented.

## 2023-08-03 LAB — RPR: RPR Ser Ql: NONREACTIVE

## 2023-08-03 LAB — CBC
HCT: 26.2 % — ABNORMAL LOW (ref 36.0–46.0)
Hemoglobin: 8 g/dL — ABNORMAL LOW (ref 12.0–15.0)
MCH: 21 pg — ABNORMAL LOW (ref 26.0–34.0)
MCHC: 30.5 g/dL (ref 30.0–36.0)
MCV: 68.8 fL — ABNORMAL LOW (ref 80.0–100.0)
Platelets: 281 10*3/uL (ref 150–400)
RBC: 3.81 MIL/uL — ABNORMAL LOW (ref 3.87–5.11)
RDW: 19.1 % — ABNORMAL HIGH (ref 11.5–15.5)
WBC: 17.4 10*3/uL — ABNORMAL HIGH (ref 4.0–10.5)
nRBC: 0.1 % (ref 0.0–0.2)

## 2023-08-03 MED ORDER — NIFEDIPINE ER OSMOTIC RELEASE 30 MG PO TB24
30.0000 mg | ORAL_TABLET | Freq: Two times a day (BID) | ORAL | Status: DC
  Administered 2023-08-03 – 2023-08-04 (×3): 30 mg via ORAL
  Filled 2023-08-03 (×3): qty 1

## 2023-08-03 MED ORDER — ONDANSETRON HCL 4 MG/2ML IJ SOLN: 4.0000 mg | Freq: Four times a day (QID) | INTRAMUSCULAR | Status: DC | PRN

## 2023-08-03 MED ORDER — POTASSIUM CHLORIDE 20 MEQ PO PACK
40.0000 meq | PACK | Freq: Every day | ORAL | Status: DC
  Filled 2023-08-03: qty 2

## 2023-08-03 MED ORDER — FERROUS SULFATE 325 (65 FE) MG PO TABS
325.0000 mg | ORAL_TABLET | ORAL | Status: DC
  Administered 2023-08-03: 325 mg via ORAL
  Filled 2023-08-03: qty 1

## 2023-08-03 MED ORDER — ONDANSETRON HCL 4 MG/2ML IJ SOLN
INTRAMUSCULAR | Status: AC
  Administered 2023-08-03: 4 mg via INTRAVENOUS
  Filled 2023-08-03: qty 2

## 2023-08-03 NOTE — Progress Notes (Signed)
Subjective: Postpartum Day 1: Cesarean Delivery Patient reports tolerating PO.    Objective: Vital signs in last 24 hours: Temp:  [97.4 F (36.3 C)-98.2 F (36.8 C)] 98.2 F (36.8 C) (02/02 0649) Pulse Rate:  [71-103] 77 (02/02 0649) Resp:  [18-25] 20 (02/02 0649) BP: (122-190)/(73-109) 136/78 (02/02 0649) SpO2:  [94 %-100 %] 95 % (02/02 0651) Weight:  [99.8 kg] 99.8 kg (02/01 1638) Vitals:   08/03/23 0318 08/03/23 0320 08/03/23 0649 08/03/23 0651  BP: (!) 152/82 (!) 149/80 136/78   Pulse: 73 73 77   Resp:   20   Temp:   98.2 F (36.8 C)   TempSrc:   Oral   SpO2:   95% 95%  Weight:      Height:        Intake/Output Summary (Last 24 hours) at 08/03/2023 0744 Last data filed at 08/03/2023 0700 Gross per 24 hour  Intake 3212.4 ml  Output 4584 ml  Net -1371.6 ml    Physical Exam:  General: alert, cooperative, and appears stated age 43: appropriate Uterine Fundus: firm Incision: healing well DVT Evaluation: No evidence of DVT seen on physical exam. Urine is still bloody  Recent Labs    08/02/23 1529  HGB 9.0*  HCT 30.0*    Assessment/Plan: Status post Cesarean section. Doing well postoperatively.  Continue current care. Magnesium x 24 hours post delivery Lasix K+ Nifeipine Leg bag training Check post op CBC  Reva Bores, MD 08/03/2023, 7:43 AM

## 2023-08-03 NOTE — Lactation Note (Signed)
This note was copied from a baby's chart. Lactation Consultation Note  Patient Name: Virginia Guerrero Date: 08/03/2023 Age:41 hours Reason for consult: Initial assessment;Term Mom is very sleepy and nauseated. RN bringing mom medication. Baby is very sleepy as well. Mom was on Mag. Before delivery. Suggested STS when mom is feeling better before attempting to latch to stimulate baby. Mom encouraged to feed baby 8-12 times/24 hours and with feeding cues.  Encouraged mom to call for assistance when needed. Told mom hope she is feeling better soon.  Maternal Data Has patient been taught Hand Expression?: No Does the patient have breastfeeding experience prior to this delivery?: Yes How long did the patient breastfeed?: her now 43 yr old for 1 yr and her older daughter 6 months  Feeding    LATCH Score                    Lactation Tools Discussed/Used    Interventions Interventions: [employer] brochure  Discharge    Consult Status Consult Status: Follow-up Date: 08/03/23 Follow-up type: In-patient    Virginia Guerrero, Diamond Nickel 08/03/2023, 12:15 AM

## 2023-08-03 NOTE — Lactation Note (Signed)
This note was copied from a baby's chart. Lactation Consultation Note  Patient Name: Virginia Guerrero [id number] Date: 08/03/2023 Age:41 hours Reason for consult: Follow-up assessment;Other (Comment);Term;Mother's request;RN request;Breastfeeding assistance (AMA)  Visited with family of 40 hours old FT female; Virginia Guerrero is a P4 and experienced breastfeeding. She requested latch assistance because baby "Ricki Rodriguez" does great latching on the L side but she hasn't been doing it to the R side. Changed baby's diaper to wake up up and took her to the R side in football hold, but as soon as she got STS she fell asleep again and wouldn't even open her mouth; baby is still spitty. An attempt was documented in flowsheets. Discussed with parents how trying a different position on the R side might help with baby's preference. Reviewed normal newborn behavior, feeding cues, cluster feeding, size of baby's stomach and anticipatory guidelines.   Maternal Data Has patient been taught Hand Expression?: Yes  Feeding Mother's Current Feeding Choice: Breast Milk  LATCH Score Latch: Too sleepy or reluctant, no latch achieved, no sucking elicited. (sleepy baby)  Audible Swallowing: None  Type of Nipple: Everted at rest and after stimulation  Comfort (Breast/Nipple): Soft / non-tender  Hold (Positioning): Assistance needed to correctly position infant at breast and maintain latch.  LATCH Score: 5  Interventions Interventions: Breast feeding basics reviewed;Assisted with latch;Skin to skin;Breast massage;Hand express;Support pillows;Education  Plan Encouraged to put baby to breast +8 times/24 hours or sooner if feeding cues are present Breast massage, hand expression and spoon/finger feeding were also encouraged  FOB present. All questions and concerns answered, family to contact [facility] services PRN.  Consult Status Consult Status: Follow-up Date: 08/04/23 Follow-up type: In-patient   Armondo Cech Venetia Constable 08/03/2023, 12:20 PM

## 2023-08-04 ENCOUNTER — Other Ambulatory Visit (HOSPITAL_COMMUNITY): Payer: Self-pay

## 2023-08-04 ENCOUNTER — Encounter (HOSPITAL_COMMUNITY): Payer: Self-pay | Admitting: Family Medicine

## 2023-08-04 MED ORDER — FUROSEMIDE 20 MG PO TABS
20.0000 mg | ORAL_TABLET | Freq: Every day | ORAL | 1 refills | Status: DC
  Filled 2023-08-04: qty 5, 5d supply, fill #0

## 2023-08-04 MED ORDER — NORETHINDRONE 0.35 MG PO TABS: 1.0000 | ORAL_TABLET | Freq: Every day | ORAL | 3 refills | Status: AC

## 2023-08-04 MED ORDER — POTASSIUM CHLORIDE CRYS ER 20 MEQ PO TBCR
40.0000 meq | EXTENDED_RELEASE_TABLET | Freq: Every day | ORAL | 0 refills | Status: AC
  Filled 2023-08-04: qty 10, 5d supply, fill #0
  Filled 2023-08-04: qty 5, 2d supply, fill #0

## 2023-08-04 MED ORDER — NIFEDIPINE ER 30 MG PO TB24
30.0000 mg | ORAL_TABLET | Freq: Two times a day (BID) | ORAL | 1 refills | Status: AC
  Filled 2023-08-04: qty 60, 30d supply, fill #0

## 2023-08-04 MED ORDER — IBUPROFEN 600 MG PO TABS
600.0000 mg | ORAL_TABLET | Freq: Four times a day (QID) | ORAL | 0 refills | Status: AC
  Filled 2023-08-04: qty 30, 8d supply, fill #0

## 2023-08-04 MED ORDER — FERROUS SULFATE 325 (65 FE) MG PO TABS
325.0000 mg | ORAL_TABLET | ORAL | 3 refills | Status: AC
  Filled 2023-08-04: qty 45, 90d supply, fill #0

## 2023-08-04 MED ORDER — OXYCODONE HCL 5 MG PO TABS
5.0000 mg | ORAL_TABLET | ORAL | 0 refills | Status: AC | PRN
  Filled 2023-08-04: qty 20, 2d supply, fill #0

## 2023-08-04 NOTE — Lactation Note (Signed)
This note was copied from a baby's chart. Lactation Consultation Note  Patient Name: Virginia Guerrero [id number] Date: 08/04/2023 Age:41 hours Reason for consult: Follow-up assessment;Term;Infant weight loss;Other (Comment) (AMA)  LC in to visit with P4 Mom of term baby delivered by C/Section.  Baby is at a 9% weight loss  today.  Pediatrician in today and Mom agreed to supplement baby with formula by bottle.  Mom has been using a hand pump occasionally.  Mom was upset as no one offered to set the pump up for her.  Mom states she never said she wanted to exclusively breastfeed baby.  Baby sleeping swaddled currently.  Mom reports that baby has been latching well, hearing swallows, no nipple pain or trauma noted.  LC shared that she would like to observe baby latching and encouraged her to call.  LC set up the DEBP and assisted with first double pumping.  Mom encouraged to feed baby her EBM before using formula.    Mom's desire is to pump and bottle feed now as she wants to do what is best for baby.   CBG to be taken after baby is supplemented.  Plan written on dry erase board- 1- STS as much as possible 2- Offer the breast often, at least every 3 hrs, with cues 3-Supplement with EBM+/formula by paced bottle 4- Pump both breasts on initiation setting for 15 mins 5- ask for help prn   Lactation Tools Discussed/Used Tools: Pump;Flanges;Bottle;Hands-free pumping top Flange Size: 21 Breast pump type: Double-Electric Breast Pump Pump Education: Setup, frequency, and cleaning;Milk Storage Reason for Pumping: support milk supply Pumping frequency: Encouraged pumping after baby is breastfed, or any time baby is supplemented by bottle  Interventions Interventions: Breast feeding basics reviewed;Skin to skin;Breast massage;Hand express;Expressed milk;DEBP;Education;Pace feeding  Discharge Pump: Personal;DEBP  Consult Status Consult Status: Follow-up Date: 08/05/23 Follow-up type:  In-patient    Virginia Guerrero 08/04/2023, 11:40 AM

## 2023-08-04 NOTE — Anesthesia Postprocedure Evaluation (Signed)
Anesthesia Post Note  Patient: Virginia Guerrero  Procedure(s) Performed: CESAREAN SECTION     Patient location during evaluation: PACU Anesthesia Type: Spinal Level of consciousness: oriented and awake and alert Pain management: pain level controlled Vital Signs Assessment: post-procedure vital signs reviewed and stable Respiratory status: spontaneous breathing, respiratory function stable and patient connected to nasal cannula oxygen Cardiovascular status: blood pressure returned to baseline and stable Postop Assessment: no headache, no backache and no apparent nausea or vomiting Anesthetic complications: no  No notable events documented.  Last Vitals:  Vitals:   08/04/23 0346 08/04/23 0804  BP: 139/78 (!) 154/86  Pulse: 82 77  Resp: 20 17  Temp: 36.7 C 37 C  SpO2: 98% 100%    Last Pain:  Vitals:   08/04/23 0807  TempSrc:   PainSc: 4                  Jarmel Linhardt L Clarence Dunsmore

## 2023-08-04 NOTE — Social Work (Signed)
MOB was referred for history of depression/anxiety. * Referral screened out by Clinical Social Worker because none of the following criteria appear to apply: ~ History of anxiety/depression during this pregnancy, or of post-partum depression following prior delivery. ~ Diagnosis of anxiety and/or depression within last 3 years. Per chart review, MOB anxiety and depression diagnosis initial onset 2012 and noted to have been resolved (04/24/23). There were no mental health concerns noted in prenatal record during this pregnancy.  OR * MOB's symptoms currently being treated with medication and/or therapy.  Please contact the Clinical Social Worker if needs arise, by [facility] request, or if MOB scores greater than 9/yes to question 10 on [place] Postpartum Depression Screen. MOB completed [place] with a score of :0.  Vivi Barrack, MSW, LCSW Clinical Social Worker  [phone] Apr 12, 2024  11:13 AM

## 2023-08-07 ENCOUNTER — Inpatient Hospital Stay (HOSPITAL_COMMUNITY): Admit: 2023-08-07 | Payer: Medicaid Other

## 2023-08-12 ENCOUNTER — Other Ambulatory Visit: Payer: Self-pay

## 2023-08-12 ENCOUNTER — Ambulatory Visit: Payer: Medicaid Other

## 2023-08-12 ENCOUNTER — Other Ambulatory Visit: Payer: Medicaid Other

## 2023-08-12 VITALS — BP 148/93 | HR 79 | Ht 64.0 in | Wt 221.0 lb

## 2023-08-12 DIAGNOSIS — Z0130 Encounter for examination of blood pressure without abnormal findings: Secondary | ICD-10-CM

## 2023-08-12 DIAGNOSIS — Z4889 Encounter for other specified surgical aftercare: Secondary | ICD-10-CM

## 2023-08-12 DIAGNOSIS — Z8759 Personal history of other complications of pregnancy, childbirth and the puerperium: Secondary | ICD-10-CM

## 2023-08-12 LAB — CBC
Hematocrit: 28.6 % — ABNORMAL LOW (ref 34.0–46.6)
Hemoglobin: 8.8 g/dL — ABNORMAL LOW (ref 11.1–15.9)
MCH: 21.2 pg — ABNORMAL LOW (ref 26.6–33.0)
MCHC: 30.8 g/dL — ABNORMAL LOW (ref 31.5–35.7)
MCV: 69 fL — ABNORMAL LOW (ref 79–97)
Platelets: 341 10*3/uL (ref 150–450)
RBC: 4.15 x10E6/uL (ref 3.77–5.28)
RDW: 18.2 % — ABNORMAL HIGH (ref 11.7–15.4)
WBC: 6.3 10*3/uL (ref 3.4–10.8)

## 2023-08-12 LAB — COMPREHENSIVE METABOLIC PANEL
ALT: 52 [IU]/L — ABNORMAL HIGH (ref 0–32)
AST: 58 [IU]/L — ABNORMAL HIGH (ref 0–40)
Albumin: 3.9 g/dL (ref 3.9–4.9)
Alkaline Phosphatase: 123 [IU]/L — ABNORMAL HIGH (ref 44–121)
BUN/Creatinine Ratio: 17 (ref 9–23)
BUN: 12 mg/dL (ref 6–24)
Bilirubin Total: 0.3 mg/dL (ref 0.0–1.2)
CO2: 21 mmol/L (ref 20–29)
Calcium: 9.1 mg/dL (ref 8.7–10.2)
Chloride: 104 mmol/L (ref 96–106)
Creatinine, Ser: 0.7 mg/dL (ref 0.57–1.00)
Globulin, Total: 2.7 g/dL (ref 1.5–4.5)
Glucose: 90 mg/dL (ref 70–99)
Potassium: 4.1 mmol/L (ref 3.5–5.2)
Sodium: 140 mmol/L (ref 134–144)
Total Protein: 6.6 g/dL (ref 6.0–8.5)
eGFR: 112 mL/min/{1.73_m2} (ref >59)

## 2023-08-12 MED ORDER — NIFEDIPINE ER OSMOTIC RELEASE 60 MG PO TB24: 60.0000 mg | ORAL_TABLET | Freq: Two times a day (BID) | ORAL | 0 refills | Status: AC

## 2023-08-12 MED ORDER — NIFEDIPINE ER OSMOTIC RELEASE 60 MG PO TB24: 60.0000 mg | ORAL_TABLET | Freq: Every day | ORAL | 0 refills | Status: DC

## 2023-08-12 NOTE — Progress Notes (Signed)
Blood Pressure Check Visit  Virginia Guerrero is here for blood pressure check following repeat c-section on 08/02/23. Diagnosis severe pre-eclampsia during pregnancy. BP today is 159/101. Repeat BP is 161/94. Patient denies any dizzness blurred vision headache shortness of breath peripheral edema. Consulted with Dr. Crissie Reese, advised to recheck BP again. BP recheck was 148/93. Per provider, increase Nifedipine to 60 mg BID and order CBC and CMP. Patient verbalized understanding. Review return precaution to go to the MAU.  Patient is also here for incision check. Patient is 10 days post c-section. Assessment: Honeycomb dressing intact. RN removed dressing. Incision is clean, dry, intact and well approximated. No s/s of infection noted.  Education: Reviewed good wound care and s/s of infection with patient. Patient will follow up post partum visit  on 09/15/23. Patient confirmed scheduled appointment.   Patient had foley after discharge. Urine in foley bag is clear yellow. Consulted with physician, ok to remove foley and to void before leaving our office. Patient voided without complications.  Quintella Reichert, RN 08/12/2023  8:38 AM

## 2023-08-14 ENCOUNTER — Telehealth: Payer: Self-pay | Admitting: General Practice

## 2023-08-14 NOTE — Telephone Encounter (Signed)
-----   Message from Venora Maples sent at 08/14/2023  9:39 AM EST ----- Borderline elevation of LFT's on labs checked at last visit, please have patient return for repeat BP and labs (CBC/CMP) tomorrow 08/15/23

## 2023-08-14 NOTE — Telephone Encounter (Signed)
Called patient, no answer- left message to call us back regarding results & an appt

## 2023-08-25 ENCOUNTER — Encounter: Payer: Self-pay | Admitting: *Deleted

## 2023-08-25 NOTE — Telephone Encounter (Signed)
 I called patient and left a message I am calling with results and to schedule an appointment and important we talk with you asap. Please call office. I also sent a MyChart message and am sending a letter to the patient. Nancy Fetter

## 2023-09-15 ENCOUNTER — Ambulatory Visit: Payer: Medicaid Other | Admitting: Obstetrics and Gynecology
